# Patient Record
Sex: Male | Born: 1955 | Race: White | Hispanic: No | Marital: Married | Smoking: Former smoker
Health system: Southern US, Community
[De-identification: ages and names within clinical notes are randomized; demographics above are authoritative.]

## PROBLEM LIST (undated history)

## (undated) DIAGNOSIS — R011 Cardiac murmur, unspecified: Secondary | ICD-10-CM

## (undated) DIAGNOSIS — E785 Hyperlipidemia, unspecified: Secondary | ICD-10-CM

## (undated) DIAGNOSIS — Q251 Coarctation of aorta: Secondary | ICD-10-CM

## (undated) DIAGNOSIS — I10 Essential (primary) hypertension: Secondary | ICD-10-CM

## (undated) DIAGNOSIS — I255 Ischemic cardiomyopathy: Secondary | ICD-10-CM

## (undated) DIAGNOSIS — Q2381 Bicuspid aortic valve: Secondary | ICD-10-CM

## (undated) DIAGNOSIS — G4730 Sleep apnea, unspecified: Secondary | ICD-10-CM

## (undated) DIAGNOSIS — Q231 Congenital insufficiency of aortic valve: Secondary | ICD-10-CM

## (undated) DIAGNOSIS — I2510 Atherosclerotic heart disease of native coronary artery without angina pectoris: Secondary | ICD-10-CM

## (undated) DIAGNOSIS — E7800 Pure hypercholesterolemia, unspecified: Secondary | ICD-10-CM

## (undated) HISTORY — PX: TEE WITHOUT CARDIOVERSION: SHX5443

## (undated) HISTORY — PX: INSERT / REPLACE / REMOVE PACEMAKER: SUR710

## (undated) HISTORY — PX: CARDIAC CATHETERIZATION: SHX172

## (undated) HISTORY — PX: HERNIA REPAIR: SHX51

## (undated) HISTORY — PX: COARCTATION OF AORTA EXCISION: SUR504

## (undated) HISTORY — PX: OTHER SURGICAL HISTORY: SHX169

## (undated) HISTORY — PX: REPAIR / RESECT / TRANSPLANT BICEPS TENDON: SUR1150

---

## 2012-08-06 ENCOUNTER — Ambulatory Visit: Payer: Self-pay

## 2012-08-31 ENCOUNTER — Ambulatory Visit: Payer: Self-pay | Admitting: Emergency Medicine

## 2012-08-31 LAB — DOT URINE DIP
Blood: NEGATIVE
Protein: NEGATIVE

## 2013-04-26 ENCOUNTER — Ambulatory Visit: Payer: Self-pay | Admitting: Internal Medicine

## 2013-08-12 ENCOUNTER — Encounter: Payer: Self-pay | Admitting: Cardiothoracic Surgery

## 2013-08-12 ENCOUNTER — Emergency Department: Payer: Self-pay | Admitting: Emergency Medicine

## 2013-08-12 LAB — CBC
HCT: 38.9 % — ABNORMAL LOW (ref 40.0–52.0)
MCH: 31.4 pg (ref 26.0–34.0)
MCHC: 33.8 g/dL (ref 32.0–36.0)
MCV: 93 fL (ref 80–100)

## 2013-08-12 LAB — BASIC METABOLIC PANEL
BUN: 13 mg/dL (ref 7–18)
Calcium, Total: 9.6 mg/dL (ref 8.5–10.1)
Creatinine: 1.04 mg/dL (ref 0.60–1.30)
EGFR (African American): >60
EGFR (Non-African Amer.): >60
Osmolality: 278 (ref 275–301)
Sodium: 139 mmol/L (ref 136–145)

## 2013-08-22 ENCOUNTER — Encounter: Payer: Self-pay | Admitting: Cardiothoracic Surgery

## 2013-09-12 ENCOUNTER — Encounter: Payer: Self-pay | Admitting: Cardiothoracic Surgery

## 2013-09-22 ENCOUNTER — Encounter: Payer: Self-pay | Admitting: Cardiothoracic Surgery

## 2013-10-20 ENCOUNTER — Encounter: Payer: Self-pay | Admitting: Cardiothoracic Surgery

## 2013-11-20 ENCOUNTER — Encounter: Payer: Self-pay | Admitting: Cardiothoracic Surgery

## 2014-12-18 ENCOUNTER — Emergency Department
Admit: 2014-12-18 | Disposition: A | Discharge status: Home / Self Care | Payer: Self-pay | Admitting: Emergency Medicine

## 2015-05-21 ENCOUNTER — Other Ambulatory Visit: Payer: Self-pay | Admitting: Nurse Practitioner

## 2015-05-21 DIAGNOSIS — R1013 Epigastric pain: Secondary | ICD-10-CM

## 2015-05-21 DIAGNOSIS — R1011 Right upper quadrant pain: Secondary | ICD-10-CM

## 2015-05-21 DIAGNOSIS — K219 Gastro-esophageal reflux disease without esophagitis: Secondary | ICD-10-CM

## 2015-05-22 ENCOUNTER — Ambulatory Visit
Admission: RE | Admit: 2015-05-22 | Discharge: 2015-05-22 | Disposition: A | Discharge status: Home / Self Care | Payer: Self-pay | Source: Ambulatory Visit | Attending: Nurse Practitioner | Admitting: Nurse Practitioner

## 2015-05-22 DIAGNOSIS — R1013 Epigastric pain: Secondary | ICD-10-CM

## 2015-05-22 DIAGNOSIS — K760 Fatty (change of) liver, not elsewhere classified: Secondary | ICD-10-CM | POA: Insufficient documentation

## 2015-05-22 DIAGNOSIS — R1011 Right upper quadrant pain: Secondary | ICD-10-CM

## 2015-05-22 DIAGNOSIS — K219 Gastro-esophageal reflux disease without esophagitis: Secondary | ICD-10-CM

## 2015-05-22 DIAGNOSIS — R918 Other nonspecific abnormal finding of lung field: Secondary | ICD-10-CM | POA: Insufficient documentation

## 2015-05-22 MED ORDER — IOHEXOL 350 MG/ML SOLN
125.0000 mL | Freq: Once | INTRAVENOUS | Status: AC | PRN
  Administered 2015-05-22: 125 mL via INTRAVENOUS

## 2015-06-11 ENCOUNTER — Encounter: Payer: Self-pay | Admitting: *Deleted

## 2015-06-12 ENCOUNTER — Ambulatory Visit: Payer: MEDICAID | Admitting: Anesthesiology

## 2015-06-12 ENCOUNTER — Ambulatory Visit: Payer: Self-pay | Admitting: Anesthesiology

## 2015-06-12 ENCOUNTER — Ambulatory Visit
Admission: RE | Admit: 2015-06-12 | Discharge: 2015-06-12 | Disposition: A | Discharge status: Home / Self Care | Payer: Self-pay | Source: Ambulatory Visit | Attending: Gastroenterology | Admitting: Gastroenterology

## 2015-06-12 ENCOUNTER — Encounter: Payer: Self-pay | Admitting: *Deleted

## 2015-06-12 ENCOUNTER — Encounter
Admission: RE | Disposition: A | Discharge status: Home / Self Care | Payer: Self-pay | Source: Ambulatory Visit | Attending: Gastroenterology

## 2015-06-12 DIAGNOSIS — R1011 Right upper quadrant pain: Secondary | ICD-10-CM | POA: Insufficient documentation

## 2015-06-12 DIAGNOSIS — I10 Essential (primary) hypertension: Secondary | ICD-10-CM | POA: Insufficient documentation

## 2015-06-12 DIAGNOSIS — Z79899 Other long term (current) drug therapy: Secondary | ICD-10-CM | POA: Insufficient documentation

## 2015-06-12 DIAGNOSIS — G4733 Obstructive sleep apnea (adult) (pediatric): Secondary | ICD-10-CM | POA: Insufficient documentation

## 2015-06-12 DIAGNOSIS — Z87891 Personal history of nicotine dependence: Secondary | ICD-10-CM | POA: Insufficient documentation

## 2015-06-12 DIAGNOSIS — E785 Hyperlipidemia, unspecified: Secondary | ICD-10-CM | POA: Insufficient documentation

## 2015-06-12 DIAGNOSIS — Z955 Presence of coronary angioplasty implant and graft: Secondary | ICD-10-CM | POA: Insufficient documentation

## 2015-06-12 DIAGNOSIS — Z7982 Long term (current) use of aspirin: Secondary | ICD-10-CM | POA: Insufficient documentation

## 2015-06-12 DIAGNOSIS — Z7902 Long term (current) use of antithrombotics/antiplatelets: Secondary | ICD-10-CM | POA: Insufficient documentation

## 2015-06-12 DIAGNOSIS — R1013 Epigastric pain: Secondary | ICD-10-CM | POA: Insufficient documentation

## 2015-06-12 DIAGNOSIS — I2510 Atherosclerotic heart disease of native coronary artery without angina pectoris: Secondary | ICD-10-CM | POA: Insufficient documentation

## 2015-06-12 DIAGNOSIS — Z7951 Long term (current) use of inhaled steroids: Secondary | ICD-10-CM | POA: Insufficient documentation

## 2015-06-12 DIAGNOSIS — K210 Gastro-esophageal reflux disease with esophagitis: Secondary | ICD-10-CM | POA: Insufficient documentation

## 2015-06-12 DIAGNOSIS — E7800 Pure hypercholesterolemia, unspecified: Secondary | ICD-10-CM | POA: Insufficient documentation

## 2015-06-12 HISTORY — DX: Cardiac murmur, unspecified: R01.1

## 2015-06-12 HISTORY — DX: Pure hypercholesterolemia, unspecified: E78.00

## 2015-06-12 HISTORY — DX: Bicuspid aortic valve: Q23.81

## 2015-06-12 HISTORY — PX: ESOPHAGOGASTRODUODENOSCOPY (EGD) WITH PROPOFOL: SHX5813

## 2015-06-12 HISTORY — DX: Hyperlipidemia, unspecified: E78.5

## 2015-06-12 HISTORY — DX: Essential (primary) hypertension: I10

## 2015-06-12 HISTORY — DX: Congenital insufficiency of aortic valve: Q23.1

## 2015-06-12 HISTORY — DX: Ischemic cardiomyopathy: I25.5

## 2015-06-12 HISTORY — DX: Sleep apnea, unspecified: G47.30

## 2015-06-12 HISTORY — DX: Atherosclerotic heart disease of native coronary artery without angina pectoris: I25.10

## 2015-06-12 HISTORY — DX: Coarctation of aorta: Q25.1

## 2015-06-12 SURGERY — ESOPHAGOGASTRODUODENOSCOPY (EGD) WITH PROPOFOL
Anesthesia: General

## 2015-06-12 MED ORDER — GLYCOPYRROLATE 0.2 MG/ML IJ SOLN
INTRAMUSCULAR | Status: DC | PRN
  Administered 2015-06-12: 0.2 mg via INTRAVENOUS

## 2015-06-12 MED ORDER — LIDOCAINE HCL (CARDIAC) 20 MG/ML IV SOLN
INTRAVENOUS | Status: DC | PRN
  Administered 2015-06-12: 50 mg via INTRAVENOUS

## 2015-06-12 MED ORDER — PROPOFOL 10 MG/ML IV BOLUS
INTRAVENOUS | Status: DC | PRN
  Administered 2015-06-12: 100 mg via INTRAVENOUS

## 2015-06-12 MED ORDER — SODIUM CHLORIDE 0.9 % IV SOLN
INTRAVENOUS | Status: DC
  Administered 2015-06-12: 10:00:00 via INTRAVENOUS

## 2015-06-12 MED ORDER — PROPOFOL 500 MG/50ML IV EMUL
INTRAVENOUS | Status: DC | PRN
Start: 2015-06-12 — End: 2015-06-12
  Administered 2015-06-12: 120 ug/kg/min via INTRAVENOUS

## 2015-06-12 NOTE — Transfer of Care (Signed)
Immediate Anesthesia Transfer of Care Note  Patient: Andrew Huang  Procedure(s) Performed: Procedure(s): ESOPHAGOGASTRODUODENOSCOPY (EGD) WITH PROPOFOL (N/A)  Patient Location: Endoscopy Unit  Anesthesia Type:General  Level of Consciousness: awake  Airway & Oxygen Therapy: Patient Spontanous Breathing  Post-op Assessment: Report given to RN  Post vital signs: Reviewed  Last Vitals:  Filed Vitals:   06/12/15 1120  BP: 121/91  Pulse:   Temp: 36.4 C  Resp: 16    Complications: No apparent anesthesia complications

## 2015-06-12 NOTE — Op Note (Signed)
[facility] Gastroenterology Patient Name: Andrew Huang Procedure Date: 06/12/2015 11:05 AM MRN: [id number] Account #: [id number] Date of Birth: Feb 10, 1956 Admit Type: Outpatient Age: 59 Room: [facility] ENDO ROOM 4 Gender: Male Note Status: Finalized Procedure:         Upper GI endoscopy Indications:       Epigastric abdominal pain, Abdominal pain in the right                     upper quadrant, Suspected esophageal reflux Providers:         Ezzard Standing. Bluford Kaufmann, MD Referring MD:      Bobbie Stack Juliann Pares, MD (Referring MD) Medicines:         Monitored Anesthesia Care Complications:     No immediate complications. Procedure:         Pre-Anesthesia Assessment:                    - Prior to the procedure, a History and Physical was                     performed, and patient medications, allergies and                     sensitivities were reviewed. The patient's tolerance of                     previous anesthesia was reviewed.                    - The risks and benefits of the procedure and the sedation                     options and risks were discussed with the patient. All                     questions were answered and informed consent was obtained.                    - After reviewing the risks and benefits, the patient was                     deemed in satisfactory condition to undergo the procedure.                    After obtaining informed consent, the endoscope was passed                     under direct vision. Throughout the procedure, the                     patient's blood pressure, pulse, and oxygen saturations                     were monitored continuously. The Endoscope was introduced                     through the mouth, and advanced to the second part of                     duodenum. The upper GI endoscopy was accomplished without                     difficulty. The patient tolerated the procedure well. Findings:      LA Grade A (one  or more mucosal breaks  less than 5 mm, not extending       between tops of 2 mucosal folds) esophagitis was found at the       gastroesophageal junction. Biopsies were taken with a cold forceps for       histology.      The exam was otherwise without abnormality.      The entire examined stomach was normal.      The examined duodenum was normal. Impression:        - LA Grade A reflux esophagitis. Biopsied.                    - The examination was otherwise normal.                    - Normal stomach.                    - Normal examined duodenum. Recommendation:    - Discharge patient to home.                    - Await pathology results.                    - The findings and recommendations were discussed with the                     patient. Procedure Code(s): --- Professional ---                    [phone], Esophagogastroduodenoscopy, flexible, transoral;                     with biopsy, single or multiple Diagnosis Code(s): --- Professional ---                    K21.0, Gastro-esophageal reflux disease with esophagitis                    R10.13, Epigastric pain                    R10.11, Right upper quadrant pain CPT copyright 2014 American Medical Association. All rights reserved. The codes documented in this report are preliminary and upon coder review may  be revised to meet current compliance requirements. Wallace CullensPaul Y Pierce Biagini, MD 06/12/2015 11:13:55 AM This report has been signed electronically. Number of Addenda: 0 Note Initiated On: 06/12/2015 11:05 AM      [facility]

## 2015-06-12 NOTE — Anesthesia Preprocedure Evaluation (Signed)
Anesthesia Evaluation  Patient identified by MRN, date of birth, ID band Patient awake    Reviewed: Allergy & Precautions, H&P , NPO status , Patient's Chart, lab work & pertinent test results, reviewed documented beta blocker date and time   History of Anesthesia Complications Negative for: history of anesthetic complications  Airway Mallampati: III  TM Distance: >3 FB Neck ROM: full    Dental no notable dental hx. (+) Teeth Intact   Pulmonary neg shortness of breath, sleep apnea and Continuous Positive Airway Pressure Ventilation , neg COPD, neg recent URI, former smoker,    Pulmonary exam normal breath sounds clear to auscultation       Cardiovascular Exercise Tolerance: Good hypertension, On Medications (-) angina+ CAD and + Cardiac Stents (last placed in 2011)  (-) Past MI and (-) CABG Normal cardiovascular exam(-) dysrhythmias + Valvular Problems/Murmurs (s/p AVR) AS  Rhythm:regular Rate:Normal     Neuro/Psych neg Seizures TIAnegative psych ROS   GI/Hepatic negative GI ROS, Neg liver ROS,   Endo/Other  negative endocrine ROS  Renal/GU negative Renal ROS  negative genitourinary   Musculoskeletal   Abdominal   Peds  Hematology negative hematology ROS (+)   Anesthesia Other Findings Past Medical History:   Coronary artery disease                                      Aortic coarctation                                           Bicuspid aortic valve                                        Heart murmur                                                 Hyperlipidemia                                               Hypercholesteremia                                           Hyperlipidemia                                               Hypertension                                                 Ischemic cardiomyopathy  Sleep apnea                                                  Reproductive/Obstetrics negative OB ROS                             Anesthesia Physical Anesthesia Plan  ASA: III  Anesthesia Plan: General   Post-op Pain Management:    Induction:   Airway Management Planned:   Additional Equipment:   Intra-op Plan:   Post-operative Plan:   Informed Consent: I have reviewed the patients History and Physical, chart, labs and discussed the procedure including the risks, benefits and alternatives for the proposed anesthesia with the patient or authorized representative who has indicated his/her understanding and acceptance.   Dental Advisory Given  Plan Discussed with: Anesthesiologist, CRNA and Surgeon  Anesthesia Plan Comments:         Anesthesia Quick Evaluation

## 2015-06-12 NOTE — H&P (Signed)
Date of Initial H&P:05/21/2015  History reviewed, patient examined, no change in status, stable for surgery.   

## 2015-06-15 LAB — SURGICAL PATHOLOGY

## 2015-06-15 NOTE — Anesthesia Postprocedure Evaluation (Signed)
  Anesthesia Post-op Note  Patient: Andrew Huang  Procedure(s) Performed: Procedure(s): ESOPHAGOGASTRODUODENOSCOPY (EGD) WITH PROPOFOL (N/A)  Anesthesia type:General  Patient location: PACU  Post pain: Pain level controlled  Post assessment: Post-op Vital signs reviewed, Patient's Cardiovascular Status Stable, Respiratory Function Stable, Patent Airway and No signs of Nausea or vomiting  Post vital signs: Reviewed and stable  Last Vitals:  Filed Vitals:   06/12/15 1200  BP: 120/96  Pulse: 70  Temp:   Resp: 20    Level of consciousness: awake, alert  and patient cooperative  Complications: No apparent anesthesia complications

## 2015-06-18 ENCOUNTER — Encounter: Payer: Self-pay | Admitting: Gastroenterology

## 2016-08-08 ENCOUNTER — Other Ambulatory Visit: Payer: Self-pay | Admitting: Gastroenterology

## 2016-08-08 DIAGNOSIS — R911 Solitary pulmonary nodule: Secondary | ICD-10-CM

## 2016-08-16 ENCOUNTER — Ambulatory Visit: Payer: Self-pay

## 2016-09-29 ENCOUNTER — Ambulatory Visit
Admission: EM | Admit: 2016-09-29 | Discharge: 2016-09-29 | Disposition: A | Discharge status: Home / Self Care | Payer: Self-pay | Attending: Family Medicine | Admitting: Family Medicine

## 2016-09-29 ENCOUNTER — Ambulatory Visit
Admission: RE | Admit: 2016-09-29 | Discharge: 2016-09-29 | Disposition: A | Discharge status: Home / Self Care | Payer: Self-pay | Source: Ambulatory Visit | Attending: Family Medicine | Admitting: Family Medicine

## 2016-09-29 ENCOUNTER — Encounter: Payer: Self-pay | Admitting: *Deleted

## 2016-09-29 ENCOUNTER — Ambulatory Visit: Payer: Self-pay

## 2016-09-29 DIAGNOSIS — K5732 Diverticulitis of large intestine without perforation or abscess without bleeding: Secondary | ICD-10-CM | POA: Insufficient documentation

## 2016-09-29 DIAGNOSIS — R1032 Left lower quadrant pain: Secondary | ICD-10-CM | POA: Insufficient documentation

## 2016-09-29 LAB — COMPREHENSIVE METABOLIC PANEL
ALBUMIN: 4.7 g/dL (ref 3.5–5.0)
ALK PHOS: 81 U/L (ref 38–126)
ALT: 25 U/L (ref 17–63)
AST: 23 U/L (ref 15–41)
Anion gap: 10 (ref 5–15)
BUN: 14 mg/dL (ref 6–20)
CALCIUM: 9.3 mg/dL (ref 8.9–10.3)
CHLORIDE: 98 mmol/L — AB (ref 101–111)
CO2: 26 mmol/L (ref 22–32)
CREATININE: 1.08 mg/dL (ref 0.61–1.24)
GFR calc Af Amer: >60 mL/min (ref >60)
GFR calc non Af Amer: >60 mL/min (ref >60)
GLUCOSE: 95 mg/dL (ref 65–99)
Potassium: 3.6 mmol/L (ref 3.5–5.1)
SODIUM: 134 mmol/L — AB (ref 135–145)
Total Bilirubin: 1.1 mg/dL (ref 0.3–1.2)
Total Protein: 8.5 g/dL — ABNORMAL HIGH (ref 6.5–8.1)

## 2016-09-29 LAB — URINALYSIS, COMPLETE (UACMP) WITH MICROSCOPIC
Glucose, UA: NEGATIVE mg/dL
Hgb urine dipstick: NEGATIVE
Ketones, ur: NEGATIVE mg/dL
Leukocytes, UA: NEGATIVE
Nitrite: NEGATIVE
PROTEIN: 30 mg/dL — AB
SPECIFIC GRAVITY, URINE: 1.025 (ref 1.005–1.030)
pH: 6 (ref 5.0–8.0)

## 2016-09-29 LAB — CBC WITH DIFFERENTIAL/PLATELET
BASOS PCT: 1 %
Basophils Absolute: 0.1 10*3/uL (ref 0–0.1)
Eosinophils Absolute: 0.1 10*3/uL (ref 0–0.7)
Eosinophils Relative: 1 %
HEMATOCRIT: 47.4 % (ref 40.0–52.0)
Hemoglobin: 16.3 g/dL (ref 13.0–18.0)
LYMPHS ABS: 1.6 10*3/uL (ref 1.0–3.6)
LYMPHS PCT: 14 %
MCH: 32 pg (ref 26.0–34.0)
MCHC: 34.4 g/dL (ref 32.0–36.0)
MCV: 93 fL (ref 80.0–100.0)
MONO ABS: 1.2 10*3/uL — AB (ref 0.2–1.0)
MONOS PCT: 11 %
NEUTROS ABS: 8.3 10*3/uL — AB (ref 1.4–6.5)
Neutrophils Relative %: 73 %
Platelets: 207 10*3/uL (ref 150–440)
RBC: 5.09 MIL/uL (ref 4.40–5.90)
RDW: 13.1 % (ref 11.5–14.5)
WBC: 11.3 10*3/uL — ABNORMAL HIGH (ref 3.8–10.6)

## 2016-09-29 LAB — AMYLASE: Amylase: 93 U/L (ref 28–100)

## 2016-09-29 LAB — LIPASE, BLOOD: Lipase: 20 U/L (ref 11–51)

## 2016-09-29 MED ORDER — CIPROFLOXACIN HCL 500 MG PO TABS: 500.0000 mg | ORAL_TABLET | Freq: Two times a day (BID) | ORAL | 0 refills | Status: AC

## 2016-09-29 MED ORDER — METRONIDAZOLE 500 MG PO TABS
500.0000 mg | ORAL_TABLET | Freq: Once | ORAL | Status: AC
  Administered 2016-09-29: 500 mg via ORAL

## 2016-09-29 MED ORDER — IOPAMIDOL (ISOVUE-300) INJECTION 61%
150.0000 mL | Freq: Once | INTRAVENOUS | Status: AC | PRN
  Administered 2016-09-29: 125 mL via INTRAVENOUS

## 2016-09-29 MED ORDER — METRONIDAZOLE 500 MG PO TABS: 500.0000 mg | ORAL_TABLET | Freq: Two times a day (BID) | ORAL | 0 refills | Status: AC

## 2016-09-29 MED ORDER — CIPROFLOXACIN HCL 500 MG PO TABS
500.0000 mg | ORAL_TABLET | Freq: Once | ORAL | Status: AC
  Administered 2016-09-29: 500 mg via ORAL

## 2016-09-29 NOTE — ED Provider Notes (Signed)
[facility]    CSN: [id number] Arrival date & time: 09/29/16  1449     History   Chief Complaint Chief Complaint  Patient presents with  . Abdominal Pain    HPI Andrew Huang is a 61 y.o. male.   This is a  61 year old white male started having abdominal pain yesterday. Abdominal pain is cramping sensation state was on both sides of his lower abdomen but now is warm the left lower quadrant area. States he feels if he can have a great bowel movement things being so much better. He did have a small amount stool yesterday but was not the usual mild be expected. His eating something this afternoon but markedly decreased appetite. He had a colonoscopy about 3 years ago which was he states normal. No family history of diverticulitis Crohn's disease avulsed of colitis apparently he did smoke but he stopped in 1988. He had stents placed he's had heart valve replacement and he's had coarctation of aorta repair does well when he was 61 years old. As well as having coronary artery disease, hyperlipidemia and hernia repair.   The history is provided by the patient. No language interpreter was used.  Abdominal Pain  Pain location:  LLQ Pain quality: aching, pressure and sharp   Pain radiates to:  LLQ Pain severity:  Moderate Onset quality:  Sudden Timing:  Constant Progression:  Worsening Chronicity:  New Relieved by:  Nothing Worsened by:  Nothing Associated symptoms: belching, constipation and nausea   Risk factors: multiple surgeries and obesity     Past Medical History:  Diagnosis Date  . Aortic coarctation   . Bicuspid aortic valve   . Coronary artery disease   . Heart murmur   . Hypercholesteremia   . Hyperlipidemia   . Hyperlipidemia   . Hypertension   . Ischemic cardiomyopathy   . Sleep apnea     There are no active problems to display for this patient.   Past Surgical History:  Procedure Laterality Date  . CARDIAC CATHETERIZATION     2014 with Stent  Placement  . COARCTATION OF AORTA EXCISION     performed when patient was 61yo  . Epicardial Pacemaker Thoracotomy    . ESOPHAGOGASTRODUODENOSCOPY (EGD) WITH PROPOFOL N/A 06/12/2015   Procedure: ESOPHAGOGASTRODUODENOSCOPY (EGD) WITH PROPOFOL;  Surgeon: Wallace Cullens, MD;  Location: [facility] ENDOSCOPY;  Service: Gastroenterology;  Laterality: N/A;  . HERNIA REPAIR     Femoral Hernia  . INSERT / REPLACE / REMOVE PACEMAKER     2014  . REPAIR / RESECT / TRANSPLANT BICEPS TENDON Right   . TEE WITHOUT CARDIOVERSION         Home Medications    Prior to Admission medications   Medication Sig Start Date End Date Taking? Authorizing Provider  benazepril (LOTENSIN) 20 MG tablet Take 20 mg by mouth daily.   Yes Historical Provider, MD  clopidogrel (PLAVIX) 75 MG tablet Take 75 mg by mouth daily.   Yes Historical Provider, MD  hydrochlorothiazide (HYDRODIURIL) 12.5 MG tablet Take 12.5 mg by mouth daily.   Yes Historical Provider, MD  OMEGA-3 FATTY ACIDS PO Take 1 capsule by mouth daily.   Yes Historical Provider, MD  albuterol (PROVENTIL HFA;VENTOLIN HFA) 108 (90 BASE) MCG/ACT inhaler Inhale 2 puffs into the lungs every 6 (six) hours as needed for wheezing or shortness of breath.    Historical Provider, MD  aspirin EC 81 MG tablet Take 81 mg by mouth daily.    Historical Provider,  MD  atorvastatin (LIPITOR) 40 MG tablet Take 40 mg by mouth daily.    Historical Provider, MD  ciprofloxacin (CIPRO) 500 MG tablet Take 1 tablet (500 mg total) by mouth 2 (two) times daily. 09/29/16   Hassan Rowan, MD  esomeprazole (NEXIUM) 40 MG capsule Take 40 mg by mouth daily at 12 noon.    Historical Provider, MD  Fluticasone-Salmeterol (ADVAIR) 100-50 MCG/DOSE AEPB Inhale 1 puff into the lungs 2 (two) times daily.    Historical Provider, MD  metroNIDAZOLE (FLAGYL) 500 MG tablet Take 1 tablet (500 mg total) by mouth 2 (two) times daily. 09/29/16   Hassan Rowan, MD  sucralfate (CARAFATE) 1 G tablet Take 1 g by mouth 4 (four) times  daily -  with meals and at bedtime.    Historical Provider, MD    Family History History reviewed. No pertinent family history.  Social History Social History  Substance Use Topics  . Smoking status: Former Smoker    Quit date: 08/22/1986  . Smokeless tobacco: Never Used  . Alcohol use No     Allergies   Patient has no known allergies.   Review of Systems Review of Systems  Respiratory: Negative for apnea.   Gastrointestinal: Positive for abdominal distention, abdominal pain, constipation and nausea.  All other systems reviewed and are negative.    Physical Exam Triage Vital Signs ED Triage Vitals  Enc Vitals Group     BP 09/29/16 1513 117/75     Pulse Rate 09/29/16 1513 84     Resp 09/29/16 1513 16     Temp 09/29/16 1513 99.3 F (37.4 C)     Temp Source 09/29/16 1513 Oral     SpO2 09/29/16 1513 96 %     Weight 09/29/16 1515 235 lb (106.6 kg)     Height 09/29/16 1515 5\' 11"  (1.803 m)     Head Circumference --      Peak Flow --      Pain Score --      Pain Loc --      Pain Edu? --      Excl. in GC? --    No data found.   Updated Vital Signs BP 117/75 (BP Location: Left Arm)   Pulse 84   Temp 99.4 F (37.4 C) (Oral)   Resp 16   Ht 5\' 11"  (1.803 m)   Wt 235 lb (106.6 kg)   SpO2 96%   BMI 32.78 kg/m   Visual Acuity Right Eye Distance:   Left Eye Distance:   Bilateral Distance:    Right Eye Near:   Left Eye Near:    Bilateral Near:     Physical Exam  Constitutional: He is oriented to person, place, and time. He appears well-developed and well-nourished.  HENT:  Head: Normocephalic and atraumatic.  Right Ear: External ear normal.  Eyes: EOM are normal. Pupils are equal, round, and reactive to light.  Neck: Normal range of motion. Neck supple.  Cardiovascular: Normal rate and regular rhythm.  Exam reveals distant heart sounds.   Pulmonary/Chest: Breath sounds normal. No respiratory distress. He has no wheezes. He has no rhonchi. He has no rales.  He exhibits tenderness.  Abdominal: Soft. He exhibits distension. He exhibits no pulsatile liver and no pulsatile midline mass. Bowel sounds are decreased. There is no hepatosplenomegaly. There is tenderness. There is no rebound and no CVA tenderness. No hernia. Hernia confirmed negative in the ventral area, confirmed negative in the right inguinal area and confirmed  negative in the left inguinal area.    Musculoskeletal: Normal range of motion.  Neurological: He is alert and oriented to person, place, and time.  Skin: Skin is warm.  Psychiatric: He has a normal mood and affect.  Vitals reviewed.    UC Treatments / Results  Labs (all labs ordered are listed, but only abnormal results are displayed) Labs Reviewed  CBC WITH DIFFERENTIAL/PLATELET - Abnormal; Notable for the following:       Result Value   WBC 11.3 (*)    Neutro Abs 8.3 (*)    Monocytes Absolute 1.2 (*)    All other components within normal limits  URINALYSIS, COMPLETE (UACMP) WITH MICROSCOPIC - Abnormal; Notable for the following:    Bilirubin Urine SMALL (*)    Protein, ur 30 (*)    Squamous Epithelial / LPF 0-5 (*)    Bacteria, UA FEW (*)    All other components within normal limits  COMPREHENSIVE METABOLIC PANEL - Abnormal; Notable for the following:    Sodium 134 (*)    Chloride 98 (*)    Total Protein 8.5 (*)    All other components within normal limits  URINE CULTURE  AMYLASE  LIPASE, BLOOD    EKG  EKG Interpretation None       Radiology Ct Abdomen Pelvis W Contrast  Result Date: 09/29/2016 CLINICAL DATA:  Lower quadrant pain beginning yesterday. Diarrhea. Remote history of hernia repair and cardiac surgery. EXAM: CT ABDOMEN AND PELVIS WITH CONTRAST TECHNIQUE: Multidetector CT imaging of the abdomen and pelvis was performed using the standard protocol following bolus administration of intravenous contrast. CONTRAST:  125mL ISOVUE-300 IOPAMIDOL (ISOVUE-300) INJECTION 61% COMPARISON:  CT abdomen and  pelvis May 22, 2015 FINDINGS: LOWER CHEST: 2 mm LEFT lower lobe stable sub solid subpleural pulmonary nodule. Included heart size is normal. Moderate coronary artery calcifications. No pericardial effusion. HEPATOBILIARY: Diffusely hypodense liver compatible with steatosis. Normal gallbladder. PANCREAS: Normal. SPLEEN: Normal. ADRENALS/URINARY TRACT: Kidneys are orthotopic, demonstrating symmetric enhancement. Unchanged mild LEFT pelviectasis without frank hydronephrosis. No nephrolithiasis, or solid renal masses. The unopacified ureters are normal in course and caliber. Delayed imaging through the kidneys demonstrates symmetric prompt contrast excretion within the proximal urinary collecting system. Urinary bladder is partially distended and unremarkable. Normal adrenal glands. STOMACH/BOWEL: Mild sigmoid diverticulosis with short segment of proximal sigmoid acute diverticulitis with circumferential wall thickening and pericolonic fat stranding with trace effusion. The stomach, small bowel are normal in course and caliber without inflammatory changes. Normal appendix. VASCULAR/LYMPHATIC: Mildly ectatic infrarenal aorta at 2.3 cm, mild calcific atherosclerosis. No lymphadenopathy by CT size criteria. REPRODUCTIVE: Normal. OTHER: No intraperitoneal fluid collections or free air. MUSCULOSKELETAL: Nonacute. Minimal grade 1 L5-S1 anterolisthesis on the basis of chronic bilateral L5 pars interarticularis defects. Severe LEFT and moderate to severe RIGHT L5-S1 neural foraminal narrowing. Scattered Schmorl's nodes. IMPRESSION: Acute sigmoid diverticulitis without complication. Acute findings discussed with and reconfirmed by Dr.Lerline Valdivia on 09/29/2016 at 6:32 pm. Electronically Signed   By: Awilda Metroourtnay  Bloomer M.D.   On: 09/29/2016 18:34    Procedures Procedures (including critical care time)  Medications Ordered in UC Medications  metroNIDAZOLE (FLAGYL) tablet 500 mg (not administered)  ciprofloxacin (CIPRO)  tablet 500 mg (not administered)     Initial Impression / Assessment and Plan / UC Course  I have reviewed the triage vital signs and the nursing notes.  Pertinent labs & imaging results that were available during my care of the patient were reviewed by me and considered in my  medical decision making (see chart for details).    Strongly feel the patient has diverticulitis.Will need to get CT scan and blood work. Explained that we will discharge and attempt to get the test here.    Final Clinical Impressions(s) / UC Diagnoses   Final diagnoses:  Left lower quadrant pain  Diverticulitis of large intestine without perforation or abscess without bleeding    New Prescriptions New Prescriptions   CIPROFLOXACIN (CIPRO) 500 MG TABLET    Take 1 tablet (500 mg total) by mouth 2 (two) times daily.   METRONIDAZOLE (FLAGYL) 500 MG TABLET    Take 1 tablet (500 mg total) by mouth 2 (two) times daily.      Turns out that patient did have diverticulitis by  CT scan. Discussed case with radiologist. We will place him on Flagyl 500 mg and Cipro 500 mg twice a day for least 10 days. Because of his drugs were being closed after CT scan he requested a dose of Cipro and Flagyl here and will try to get him back on the board to do that follow-up PCP in 2 weeks as needed.    Hassan Rowan, MD 09/29/16 [phone]

## 2016-09-29 NOTE — ED Triage Notes (Signed)
Lower quadrant abd pain since yesterday. States diarrhea yesterday but no bowel movement today. Denies other symptoms.

## 2016-10-01 LAB — URINE CULTURE: Culture: NO GROWTH

## 2017-01-07 ENCOUNTER — Emergency Department
Admission: EM | Admit: 2017-01-07 | Discharge: 2017-01-07 | Disposition: A | Discharge status: Home / Self Care | Payer: Self-pay | Attending: Emergency Medicine | Admitting: Emergency Medicine

## 2017-01-07 ENCOUNTER — Encounter: Payer: Self-pay | Admitting: Emergency Medicine

## 2017-01-07 ENCOUNTER — Emergency Department: Payer: Self-pay

## 2017-01-07 DIAGNOSIS — Z7902 Long term (current) use of antithrombotics/antiplatelets: Secondary | ICD-10-CM | POA: Insufficient documentation

## 2017-01-07 DIAGNOSIS — Z87891 Personal history of nicotine dependence: Secondary | ICD-10-CM | POA: Insufficient documentation

## 2017-01-07 DIAGNOSIS — Z7982 Long term (current) use of aspirin: Secondary | ICD-10-CM | POA: Insufficient documentation

## 2017-01-07 DIAGNOSIS — I2510 Atherosclerotic heart disease of native coronary artery without angina pectoris: Secondary | ICD-10-CM | POA: Insufficient documentation

## 2017-01-07 DIAGNOSIS — I10 Essential (primary) hypertension: Secondary | ICD-10-CM | POA: Insufficient documentation

## 2017-01-07 DIAGNOSIS — Y92002 Bathroom of unspecified non-institutional (private) residence single-family (private) house as the place of occurrence of the external cause: Secondary | ICD-10-CM | POA: Insufficient documentation

## 2017-01-07 DIAGNOSIS — W19XXXA Unspecified fall, initial encounter: Secondary | ICD-10-CM

## 2017-01-07 DIAGNOSIS — Z950 Presence of cardiac pacemaker: Secondary | ICD-10-CM | POA: Insufficient documentation

## 2017-01-07 DIAGNOSIS — Z79899 Other long term (current) drug therapy: Secondary | ICD-10-CM | POA: Insufficient documentation

## 2017-01-07 DIAGNOSIS — S52502A Unspecified fracture of the lower end of left radius, initial encounter for closed fracture: Secondary | ICD-10-CM | POA: Insufficient documentation

## 2017-01-07 DIAGNOSIS — Y999 Unspecified external cause status: Secondary | ICD-10-CM | POA: Insufficient documentation

## 2017-01-07 DIAGNOSIS — W0110XA Fall on same level from slipping, tripping and stumbling with subsequent striking against unspecified object, initial encounter: Secondary | ICD-10-CM | POA: Insufficient documentation

## 2017-01-07 DIAGNOSIS — Y939 Activity, unspecified: Secondary | ICD-10-CM | POA: Insufficient documentation

## 2017-01-07 MED ORDER — DOCUSATE SODIUM 100 MG PO CAPS: ORAL_CAPSULE | ORAL | 0 refills | Status: AC

## 2017-01-07 MED ORDER — OXYCODONE-ACETAMINOPHEN 5-325 MG PO TABS
1.0000 | ORAL_TABLET | Freq: Once | ORAL | Status: AC
  Administered 2017-01-07: 1 via ORAL
  Filled 2017-01-07: qty 1

## 2017-01-07 MED ORDER — OXYCODONE-ACETAMINOPHEN 5-325 MG PO TABS
1.0000 | ORAL_TABLET | ORAL | Status: DC | PRN
  Administered 2017-01-07: 1 via ORAL

## 2017-01-07 MED ORDER — OXYCODONE-ACETAMINOPHEN 5-325 MG PO TABS: 1.0000 | ORAL_TABLET | ORAL | 0 refills | Status: DC | PRN

## 2017-01-07 MED ORDER — OXYCODONE-ACETAMINOPHEN 5-325 MG PO TABS
ORAL_TABLET | ORAL | Status: AC
  Filled 2017-01-07: qty 1

## 2017-01-07 NOTE — ED Notes (Signed)
Pt. Has wife picking up in waiting room.

## 2017-01-07 NOTE — ED Triage Notes (Signed)
Pt comes into the Ed via POV c/o left wrist pain after tripping over a bath mat and fell into the bathtub.  Patient presents trembling but states "the pain is okay I don't move it".  Patient in NAD at this time but does present with swelling to the wrist. Denies LOC.

## 2017-01-07 NOTE — ED Provider Notes (Signed)
[facility] Emergency Department Provider Note  ____________________________________________   First MD Initiated Contact with Patient 01/07/17 0114     (approximate)  I have reviewed the triage vital signs and the nursing notes.   HISTORY  Chief Complaint Wrist Pain    HPI Andrew Huang is a 61 y.o. male who presents for evaluation of acute onset sharp and severe pain in his left wrist after a fall.  He got up from bed to go to the bathroom and tripped on the rug in the bathroom and landed in the bathtub with all of his weight on the left wrist.  He did not strike his head and denies headache and neck pain.  He did not lose consciousness.  He had acute onset of pain in the wrist but has no numbness nor tingling and the pain is mild at rest but severe with any movement.  He has some mild swelling around the wrist.  He is able to move his fingers.  He sustained no other injuries.He takes a daily baby aspirin but no other blood thinners.   Past Medical History:  Diagnosis Date  . Aortic coarctation   . Bicuspid aortic valve   . Coronary artery disease   . Heart murmur   . Hypercholesteremia   . Hyperlipidemia   . Hyperlipidemia   . Hypertension   . Ischemic cardiomyopathy   . Sleep apnea     There are no active problems to display for this patient.   Past Surgical History:  Procedure Laterality Date  . CARDIAC CATHETERIZATION     2014 with Stent Placement  . COARCTATION OF AORTA EXCISION     performed when patient was 61yo  . Epicardial Pacemaker Thoracotomy    . ESOPHAGOGASTRODUODENOSCOPY (EGD) WITH PROPOFOL N/A 06/12/2015   Procedure: ESOPHAGOGASTRODUODENOSCOPY (EGD) WITH PROPOFOL;  Surgeon: Wallace Cullens, MD;  Location: [facility] ENDOSCOPY;  Service: Gastroenterology;  Laterality: N/A;  . HERNIA REPAIR     Femoral Hernia  . INSERT / REPLACE / REMOVE PACEMAKER     2014  . REPAIR / RESECT / TRANSPLANT BICEPS TENDON Right   . TEE WITHOUT  CARDIOVERSION      Prior to Admission medications   Medication Sig Start Date End Date Taking? Authorizing Provider  albuterol (PROVENTIL HFA;VENTOLIN HFA) 108 (90 BASE) MCG/ACT inhaler Inhale 2 puffs into the lungs every 6 (six) hours as needed for wheezing or shortness of breath.    [provider]  aspirin EC 81 MG tablet Take 81 mg by mouth daily.    [provider]  atorvastatin (LIPITOR) 40 MG tablet Take 40 mg by mouth daily.    [provider]  benazepril (LOTENSIN) 20 MG tablet Take 20 mg by mouth daily.    [provider]  ciprofloxacin (CIPRO) 500 MG tablet Take 1 tablet (500 mg total) by mouth 2 (two) times daily. 09/29/16   Hassan Rowan, MD  clopidogrel (PLAVIX) 75 MG tablet Take 75 mg by mouth daily.    [provider]  docusate sodium (COLACE) 100 MG capsule Take 1 tablet once or twice daily as needed for constipation while taking narcotic pain medicine 01/07/17   Loleta Rose, MD  esomeprazole (NEXIUM) 40 MG capsule Take 40 mg by mouth daily at 12 noon.    [provider]  Fluticasone-Salmeterol (ADVAIR) 100-50 MCG/DOSE AEPB Inhale 1 puff into the lungs 2 (two) times daily.    [provider]  hydrochlorothiazide (HYDRODIURIL) 12.5 MG tablet  Take 12.5 mg by mouth daily.    [provider]  metroNIDAZOLE (FLAGYL) 500 MG tablet Take 1 tablet (500 mg total) by mouth 2 (two) times daily. 09/29/16   Hassan Rowan, MD  OMEGA-3 FATTY ACIDS PO Take 1 capsule by mouth daily.    [provider]  oxyCODONE-acetaminophen (ROXICET) 5-325 MG tablet Take 1-2 tablets by mouth every 4 (four) hours as needed for severe pain. 01/07/17   Loleta Rose, MD  sucralfate (CARAFATE) 1 G tablet Take 1 g by mouth 4 (four) times daily -  with meals and at bedtime.    [provider]    Allergies Patient has no known allergies.  No family history on file.  Social History Social History  Substance Use Topics  . Smoking  status: Former Smoker    Quit date: 08/22/1986  . Smokeless tobacco: Never Used  . Alcohol use No    Review of Systems Constitutional: No fever/chills Cardiovascular: Denies chest pain. Respiratory: Denies shortness of breath. Gastrointestinal: No abdominal pain.  No nausea, no vomiting.   Genitourinary: Negative for dysuria. Musculoskeletal: ain in his left wrist after a fall.Negative for neck pain.  Negative for back pain. Integumentary: Negative for rash and lacerations Neurological: Negative for headaches, focal weakness or numbness.   ____________________________________________   PHYSICAL EXAM:  VITAL SIGNS: ED Triage Vitals  Enc Vitals Group     BP 01/07/17 0017 (!) 144/92     Pulse Rate 01/07/17 0017 76     Resp 01/07/17 0017 16     Temp 01/07/17 0017 98.2 F (36.8 C)     Temp Source 01/07/17 0017 Oral     SpO2 01/07/17 0017 100 %     Weight 01/07/17 0014 230 lb (104.3 kg)     Height 01/07/17 0014 5\' 11"  (1.803 m)     Head Circumference --      Peak Flow --      Pain Score 01/07/17 0014 8     Pain Loc --      Pain Edu? --      Excl. in GC? --     Constitutional: Alert and oriented. Well appearing and in no acute distress. Eyes: Conjunctivae are normal.  Head: Atraumatic. Cardiovascular: Normal rate, regular rhythm. Good peripheral circulationincluding in the affected extremity Respiratory: Normal respiratory effort.  No retractions.  Musculoskeletal: mild swelling around the left wrist with severe tenderness to palpation and pain with movement.  Neurovascularly intact to the hand and fingers.  I did not palpate for a pulse given the tenderness in the wrist and in the range of the radial artery but the limb is warm with normal capillary refill distally.  No significant gross deformity. Neurologic:  Normal speech and language. No gross focal neurologic deficits are appreciated.  Skin:  Skin is warm, dry and intact. No rash noted. Psychiatric: Mood and affect are  normal. Speech and behavior are normal.  ____________________________________________   LABS (all labs ordered are listed, but only abnormal results are displayed)  Labs Reviewed - No data to display ____________________________________________  EKG  None - EKG not ordered by ED physician ____________________________________________  RADIOLOGY Marylou Mccoy, personally viewed and evaluated these images (plain radiographs) as part of my medical decision making, as well as reviewing the written report by the radiologist.  Dg Wrist Complete Left  Result Date: 01/07/2017 CLINICAL DATA:  Left wrist pain after injury. Tripped over a bath mat and fell into tub. EXAM: LEFT WRIST - COMPLETE  3+ VIEW COMPARISON:  None. FINDINGS: Comminuted distal radius fracture with displaced involvement of the radiocarpal and distal radioulnar joints. Articular surface distraction of the least 4 mm. No significant angulation. No additional acute fracture. The carpal bones are intact. Soft tissue edema at the fracture site. IMPRESSION: Comminuted displaced distal radius fracture extending into the distal radioulnar and radiocarpal joints. Electronically Signed   By: Rubye OaksMelanie  Ehinger M.D.   On: 01/07/2017 00:50    ____________________________________________   PROCEDURES  Critical Care performed: No   Procedure(s) performed:   .Splint Application Date/Time: 01/07/2017 1:52 AM Performed by: Loleta RoseFORBACH, Lilyian Quayle Authorized by: Loleta RoseFORBACH, Cherron Blitzer   Consent:    Consent obtained:  Verbal   Consent given by:  Patient Pre-procedure details:    Sensation:  Normal Procedure details:    Laterality:  Left   Location:  Wrist   Wrist:  L wrist   Splint type:  Sugar tong   Supplies:  Ortho-Glass Post-procedure details:    Pain:  Unchanged   Sensation:  Normal   Patient tolerance of procedure:  Tolerated well, no immediate complications     ____________________________________________   INITIAL IMPRESSION /  ASSESSMENT AND PLAN / ED COURSE  Pertinent labs & imaging results that were available during my care of the patient were reviewed by me and considered in my medical decision making (see chart for details).  I spoke by phone with Dr. Rodena MedinEckel with orthopedics who reviewed the radiographs and believes that the patient is appropriate for splinting with no attempted reduction and outpatient follow-up.  I updated the patient with the plan and he is in agreement.  I will provide another Percocet and we will splint the wrist and elbow.  His paperwork for outpatient follow-up.  I gave my usual and customary return precautions.   Clinical Course as of Jan 08 219  Sat Jan 07, 2017  0053 DG Wrist Complete Left [CF]  0155 I reviewed the patient's prescription history over the last 12 months in the multi-state controlled substances database(s) that includes [place], [place], [place], [place], [place], [place], [place], [place], [place], [place], [place], [place], and [place].  Results were notable for regular prescriptions of Ambien but no prescriptions for opioids.  [CF]  0219 Reassessed the patient after splint placement by ED technician and the splint is in good position and the patient is neurovascularly intact.  Reiterated return precautions.  [CF]    Clinical Course User Index [CF] Loleta RoseForbach, Idamae Coccia, MD    ____________________________________________  FINAL CLINICAL IMPRESSION(S) / ED DIAGNOSES  Final diagnoses:  Closed fracture of distal end of left radius, unspecified fracture morphology, initial encounter  Fall, initial encounter     MEDICATIONS GIVEN DURING THIS VISIT:  Medications  oxyCODONE-acetaminophen (PERCOCET/ROXICET) 5-325 MG per tablet 1 tablet (1 tablet Oral Given 01/07/17 0021)  oxyCODONE-acetaminophen (PERCOCET/ROXICET) 5-325 MG per tablet 1 tablet (1 tablet Oral Given 01/07/17 0154)     NEW OUTPATIENT MEDICATIONS STARTED DURING THIS  VISIT:  New Prescriptions   DOCUSATE SODIUM (COLACE) 100 MG CAPSULE    Take 1 tablet once or twice daily as needed for constipation while taking narcotic pain medicine   OXYCODONE-ACETAMINOPHEN (ROXICET) 5-325 MG TABLET    Take 1-2 tablets by mouth every 4 (four) hours as needed for severe pain.    Modified Medications   No medications on file    Discontinued Medications   No medications on file     Note:  This document was prepared using Dragon voice  recognition software and may include unintentional dictation errors.    Loleta Rose, MD 01/07/17 [phone]

## 2017-01-07 NOTE — ED Notes (Signed)
Pt. States at about midnight tonight pt. Walked into bathroom tripped over floor mat landed on lt. Hand.  Pt. States pain to lt. Wrist.  Swelling noted to lt. Wrist.  Ice pack applied in triage.

## 2017-01-07 NOTE — Discharge Instructions (Signed)
Please read through the included information about splint care (keep it clean and dry).  If your pain becomes much more severe, the splint becomes too tight, or you feel as if your injured limb is becoming numb or cold, please return immediately to the Emergency Department.  Follow up with the orthopedics specialist listed in this paperwork.  When possible, keep your splint elevated to help with the swelling.  You may also use ice packs over the splint.

## 2017-01-23 ENCOUNTER — Ambulatory Visit: Payer: Self-pay | Admitting: Anesthesiology

## 2017-01-23 ENCOUNTER — Encounter
Admission: AD | Disposition: A | Discharge status: Home / Self Care | Payer: Self-pay | Source: Ambulatory Visit | Attending: Orthopedic Surgery

## 2017-01-23 ENCOUNTER — Ambulatory Visit: Payer: Self-pay

## 2017-01-23 ENCOUNTER — Encounter: Payer: Self-pay | Admitting: *Deleted

## 2017-01-23 ENCOUNTER — Ambulatory Visit
Admission: AD | Admit: 2017-01-23 | Discharge: 2017-01-24 | Disposition: A | Discharge status: Home / Self Care | Payer: Self-pay | Source: Ambulatory Visit | Attending: Orthopedic Surgery | Admitting: Orthopedic Surgery

## 2017-01-23 DIAGNOSIS — S52572A Other intraarticular fracture of lower end of left radius, initial encounter for closed fracture: Secondary | ICD-10-CM | POA: Insufficient documentation

## 2017-01-23 DIAGNOSIS — Z6833 Body mass index (BMI) 33.0-33.9, adult: Secondary | ICD-10-CM | POA: Insufficient documentation

## 2017-01-23 DIAGNOSIS — Z955 Presence of coronary angioplasty implant and graft: Secondary | ICD-10-CM | POA: Insufficient documentation

## 2017-01-23 DIAGNOSIS — Y939 Activity, unspecified: Secondary | ICD-10-CM | POA: Insufficient documentation

## 2017-01-23 DIAGNOSIS — K219 Gastro-esophageal reflux disease without esophagitis: Secondary | ICD-10-CM | POA: Insufficient documentation

## 2017-01-23 DIAGNOSIS — X58XXXA Exposure to other specified factors, initial encounter: Secondary | ICD-10-CM | POA: Insufficient documentation

## 2017-01-23 DIAGNOSIS — I739 Peripheral vascular disease, unspecified: Secondary | ICD-10-CM | POA: Insufficient documentation

## 2017-01-23 DIAGNOSIS — I255 Ischemic cardiomyopathy: Secondary | ICD-10-CM | POA: Insufficient documentation

## 2017-01-23 DIAGNOSIS — S52502A Unspecified fracture of the lower end of left radius, initial encounter for closed fracture: Secondary | ICD-10-CM | POA: Diagnosis present

## 2017-01-23 DIAGNOSIS — E669 Obesity, unspecified: Secondary | ICD-10-CM | POA: Insufficient documentation

## 2017-01-23 DIAGNOSIS — E7800 Pure hypercholesterolemia, unspecified: Secondary | ICD-10-CM | POA: Insufficient documentation

## 2017-01-23 DIAGNOSIS — Y929 Unspecified place or not applicable: Secondary | ICD-10-CM | POA: Insufficient documentation

## 2017-01-23 DIAGNOSIS — I2510 Atherosclerotic heart disease of native coronary artery without angina pectoris: Secondary | ICD-10-CM | POA: Insufficient documentation

## 2017-01-23 DIAGNOSIS — Z8781 Personal history of (healed) traumatic fracture: Secondary | ICD-10-CM

## 2017-01-23 DIAGNOSIS — I110 Hypertensive heart disease with heart failure: Secondary | ICD-10-CM | POA: Insufficient documentation

## 2017-01-23 DIAGNOSIS — Z79899 Other long term (current) drug therapy: Secondary | ICD-10-CM | POA: Insufficient documentation

## 2017-01-23 DIAGNOSIS — I509 Heart failure, unspecified: Secondary | ICD-10-CM | POA: Insufficient documentation

## 2017-01-23 DIAGNOSIS — G4733 Obstructive sleep apnea (adult) (pediatric): Secondary | ICD-10-CM | POA: Insufficient documentation

## 2017-01-23 DIAGNOSIS — Z9889 Other specified postprocedural states: Secondary | ICD-10-CM

## 2017-01-23 HISTORY — PX: OPEN REDUCTION INTERNAL FIXATION (ORIF) DISTAL RADIAL FRACTURE: SHX5989

## 2017-01-23 SURGERY — OPEN REDUCTION INTERNAL FIXATION (ORIF) DISTAL RADIUS FRACTURE
Anesthesia: General | Site: Wrist | Laterality: Left | Wound class: Clean

## 2017-01-23 MED ORDER — CEFAZOLIN SODIUM-DEXTROSE 2-4 GM/100ML-% IV SOLN
INTRAVENOUS | Status: AC
Start: 2017-01-23 — End: 2017-01-24
  Filled 2017-01-23: qty 100

## 2017-01-23 MED ORDER — FENTANYL CITRATE (PF) 100 MCG/2ML IJ SOLN
INTRAMUSCULAR | Status: AC
  Filled 2017-01-23: qty 2

## 2017-01-23 MED ORDER — FENTANYL CITRATE (PF) 100 MCG/2ML IJ SOLN
INTRAMUSCULAR | Status: DC | PRN
  Administered 2017-01-23: 50 ug via INTRAVENOUS
  Administered 2017-01-23: 100 ug via INTRAVENOUS
  Administered 2017-01-23 (×2): 50 ug via INTRAVENOUS

## 2017-01-23 MED ORDER — LACTATED RINGERS IV SOLN
INTRAVENOUS | Status: DC | PRN
  Administered 2017-01-23: 17:00:00 via INTRAVENOUS

## 2017-01-23 MED ORDER — ACETAMINOPHEN 10 MG/ML IV SOLN
INTRAVENOUS | Status: DC | PRN
  Administered 2017-01-23: 1000 mg via INTRAVENOUS

## 2017-01-23 MED ORDER — HYDROMORPHONE HCL 1 MG/ML IJ SOLN
INTRAMUSCULAR | Status: AC
  Filled 2017-01-23: qty 1

## 2017-01-23 MED ORDER — DEXAMETHASONE SODIUM PHOSPHATE 10 MG/ML IJ SOLN
INTRAMUSCULAR | Status: AC
  Filled 2017-01-23: qty 1

## 2017-01-23 MED ORDER — NEOMYCIN-POLYMYXIN B GU 40-200000 IR SOLN
Status: DC | PRN
  Administered 2017-01-23: 2 mL

## 2017-01-23 MED ORDER — HYDROMORPHONE HCL 1 MG/ML IJ SOLN
0.5000 mg | INTRAMUSCULAR | Status: AC | PRN
  Administered 2017-01-23 (×5): 0.5 mg via INTRAVENOUS

## 2017-01-23 MED ORDER — OXYCODONE HCL 5 MG PO TABS
ORAL_TABLET | ORAL | Status: AC
  Filled 2017-01-23: qty 1

## 2017-01-23 MED ORDER — PHENYLEPHRINE HCL 10 MG/ML IJ SOLN
INTRAMUSCULAR | Status: DC | PRN
  Administered 2017-01-23 (×2): 200 ug via INTRAVENOUS

## 2017-01-23 MED ORDER — CEFAZOLIN SODIUM-DEXTROSE 1-4 GM/50ML-% IV SOLN
1.0000 g | Freq: Four times a day (QID) | INTRAVENOUS | Status: DC
  Administered 2017-01-24 (×2): 1 g via INTRAVENOUS
  Filled 2017-01-23 (×4): qty 50

## 2017-01-23 MED ORDER — MIDAZOLAM HCL 2 MG/2ML IJ SOLN
INTRAMUSCULAR | Status: AC
  Filled 2017-01-23: qty 2

## 2017-01-23 MED ORDER — SEVOFLURANE IN SOLN
RESPIRATORY_TRACT | Status: AC
  Filled 2017-01-23: qty 250

## 2017-01-23 MED ORDER — PROMETHAZINE HCL 25 MG/ML IJ SOLN
INTRAMUSCULAR | Status: AC
  Filled 2017-01-23: qty 1

## 2017-01-23 MED ORDER — PROPOFOL 10 MG/ML IV BOLUS
INTRAVENOUS | Status: DC | PRN
  Administered 2017-01-23: 200 mg via INTRAVENOUS

## 2017-01-23 MED ORDER — SODIUM CHLORIDE 0.9 % IV SOLN: INTRAVENOUS | Status: DC

## 2017-01-23 MED ORDER — ONDANSETRON HCL 4 MG/2ML IJ SOLN
INTRAMUSCULAR | Status: DC | PRN
  Administered 2017-01-23: 4 mg via INTRAVENOUS

## 2017-01-23 MED ORDER — HYDROCODONE-ACETAMINOPHEN 5-325 MG PO TABS: 1.0000 | ORAL_TABLET | Freq: Four times a day (QID) | ORAL | 0 refills | Status: AC | PRN

## 2017-01-23 MED ORDER — FENTANYL CITRATE (PF) 100 MCG/2ML IJ SOLN
25.0000 ug | INTRAMUSCULAR | Status: DC | PRN
  Administered 2017-01-23 (×3): 50 ug via INTRAVENOUS

## 2017-01-23 MED ORDER — OXYCODONE HCL 5 MG/5ML PO SOLN: 5.0000 mg | Freq: Once | ORAL | Status: DC | PRN

## 2017-01-23 MED ORDER — DEXAMETHASONE SODIUM PHOSPHATE 10 MG/ML IJ SOLN
INTRAMUSCULAR | Status: DC | PRN
Start: 2017-01-23 — End: 2017-01-23
  Administered 2017-01-23: 10 mg via INTRAVENOUS

## 2017-01-23 MED ORDER — PROPOFOL 10 MG/ML IV BOLUS
INTRAVENOUS | Status: AC
  Filled 2017-01-23: qty 20

## 2017-01-23 MED ORDER — ONDANSETRON HCL 4 MG/2ML IJ SOLN
INTRAMUSCULAR | Status: AC
  Filled 2017-01-23: qty 2

## 2017-01-23 MED ORDER — MEPERIDINE HCL 50 MG/ML IJ SOLN: 6.2500 mg | INTRAMUSCULAR | Status: DC | PRN

## 2017-01-23 MED ORDER — LIDOCAINE HCL (CARDIAC) 20 MG/ML IV SOLN
INTRAVENOUS | Status: DC | PRN
  Administered 2017-01-23: 100 mg via INTRAVENOUS

## 2017-01-23 MED ORDER — PROMETHAZINE HCL 25 MG/ML IJ SOLN
6.2500 mg | INTRAMUSCULAR | Status: DC | PRN
  Administered 2017-01-23: 12.5 mg via INTRAVENOUS

## 2017-01-23 MED ORDER — METOCLOPRAMIDE HCL 5 MG/ML IJ SOLN: 5.0000 mg | Freq: Three times a day (TID) | INTRAMUSCULAR | Status: DC | PRN

## 2017-01-23 MED ORDER — CEFAZOLIN SODIUM-DEXTROSE 2-4 GM/100ML-% IV SOLN
2.0000 g | Freq: Once | INTRAVENOUS | Status: AC
  Administered 2017-01-23: 2 g via INTRAVENOUS

## 2017-01-23 MED ORDER — OXYCODONE HCL 5 MG PO TABS: 5.0000 mg | ORAL_TABLET | Freq: Once | ORAL | Status: DC | PRN

## 2017-01-23 MED ORDER — ACETAMINOPHEN 10 MG/ML IV SOLN
INTRAVENOUS | Status: AC
  Filled 2017-01-23: qty 100

## 2017-01-23 MED ORDER — HYDROMORPHONE HCL 1 MG/ML IJ SOLN
0.5000 mg | INTRAMUSCULAR | Status: DC | PRN
  Administered 2017-01-23: 0.5 mg via INTRAVENOUS

## 2017-01-23 MED ORDER — MIDAZOLAM HCL 2 MG/2ML IJ SOLN
INTRAMUSCULAR | Status: DC | PRN
  Administered 2017-01-23: 2 mg via INTRAVENOUS

## 2017-01-23 MED ORDER — ONDANSETRON HCL 4 MG/2ML IJ SOLN: 4.0000 mg | Freq: Four times a day (QID) | INTRAMUSCULAR | Status: DC | PRN

## 2017-01-23 MED ORDER — LACTATED RINGERS IV SOLN
Freq: Once | INTRAVENOUS | Status: AC
  Administered 2017-01-23: 15:00:00 via INTRAVENOUS

## 2017-01-23 MED ORDER — HYDROCODONE-ACETAMINOPHEN 5-325 MG PO TABS
1.0000 | ORAL_TABLET | ORAL | Status: DC | PRN
  Administered 2017-01-24 (×2): 1 via ORAL
  Filled 2017-01-23 (×2): qty 1

## 2017-01-23 MED ORDER — ONDANSETRON HCL 4 MG PO TABS: 4.0000 mg | ORAL_TABLET | Freq: Four times a day (QID) | ORAL | Status: DC | PRN

## 2017-01-23 MED ORDER — FENTANYL CITRATE (PF) 250 MCG/5ML IJ SOLN
INTRAMUSCULAR | Status: AC
  Filled 2017-01-23: qty 5

## 2017-01-23 MED ORDER — SODIUM CHLORIDE 0.9 % IV SOLN
INTRAVENOUS | Status: DC
  Administered 2017-01-23: 1000 mL via INTRAVENOUS

## 2017-01-23 MED ORDER — METOCLOPRAMIDE HCL 10 MG PO TABS: 5.0000 mg | ORAL_TABLET | Freq: Three times a day (TID) | ORAL | Status: DC | PRN

## 2017-01-23 SURGICAL SUPPLY — 38 items
BANDAGE ACE 4X5 VEL STRL LF (GAUZE/BANDAGES/DRESSINGS) ×3 IMPLANT
BIT DRILL 2 FAST STEP (BIT) ×3 IMPLANT
BIT DRILL 2.5X4 QC (BIT) ×3 IMPLANT
CANISTER SUCT 1200ML W/VALVE (MISCELLANEOUS) ×3 IMPLANT
CHLORAPREP W/TINT 26ML (MISCELLANEOUS) ×3 IMPLANT
CUFF TOURN 18 STER (MISCELLANEOUS) IMPLANT
CUFF TOURN 24 STER (MISCELLANEOUS) ×3 IMPLANT
DRAPE FLUOR MINI C-ARM 54X84 (DRAPES) ×3 IMPLANT
ELECT REM PT RETURN 9FT ADLT (ELECTROSURGICAL) ×3
ELECTRODE REM PT RTRN 9FT ADLT (ELECTROSURGICAL) ×1 IMPLANT
GAUZE PETRO XEROFOAM 1X8 (MISCELLANEOUS) ×6 IMPLANT
GAUZE SPONGE 4X4 12PLY STRL (GAUZE/BANDAGES/DRESSINGS) ×3 IMPLANT
GAUZE XEROFORM 4X4 STRL (GAUZE/BANDAGES/DRESSINGS) ×3 IMPLANT
GLOVE SURG SYN 9.0  PF PI (GLOVE) ×2
GLOVE SURG SYN 9.0 PF PI (GLOVE) ×1 IMPLANT
GOWN SRG 2XL LVL 4 RGLN SLV (GOWNS) ×1 IMPLANT
GOWN STRL NON-REIN 2XL LVL4 (GOWNS) ×2
GOWN STRL REUS W/ TWL LRG LVL3 (GOWN DISPOSABLE) ×1 IMPLANT
GOWN STRL REUS W/TWL LRG LVL3 (GOWN DISPOSABLE) ×2
KIT RM TURNOVER STRD PROC AR (KITS) ×3 IMPLANT
NEEDLE FILTER BLUNT 18X 1/2SAF (NEEDLE) ×2
NEEDLE FILTER BLUNT 18X1 1/2 (NEEDLE) ×1 IMPLANT
NS IRRIG 500ML POUR BTL (IV SOLUTION) ×3 IMPLANT
PACK EXTREMITY ARMC (MISCELLANEOUS) ×3 IMPLANT
PAD CAST CTTN 4X4 STRL (SOFTGOODS) ×1 IMPLANT
PADDING CAST COTTON 4X4 STRL (SOFTGOODS) ×2
PEG SUBCHONDRAL SMOOTH 2.0X20 (Peg) ×15 IMPLANT
PEG SUBCHONDRAL SMOOTH 2.0X22 (Peg) ×6 IMPLANT
PEG SUBCHONDRAL SMOOTH 2.0X24 (Peg) ×3 IMPLANT
PLATE SHORT 24.4X51.3 LT (Plate) ×3 IMPLANT
PLATE SHORT 24.4X51.3 RT (Plate) ×3 IMPLANT
SCREW CORT 3.5X10 LNG (Screw) ×9 IMPLANT
SPLINT CAST 1 STEP 3X12 (MISCELLANEOUS) ×3 IMPLANT
SUT ETHILON 4-0 (SUTURE) ×2
SUT ETHILON 4-0 FS2 18XMFL BLK (SUTURE) ×1
SUT VICRYL 3-0 27IN (SUTURE) ×3 IMPLANT
SUTURE ETHLN 4-0 FS2 18XMF BLK (SUTURE) ×1 IMPLANT
SYR 3ML LL SCALE MARK (SYRINGE) ×3 IMPLANT

## 2017-01-23 NOTE — Discharge Instructions (Addendum)
Keep arm elevated as much as possible. Work fingers is much as he can. Loosen Ace wrap if fingers swell. Pain medicine as directed. Keep dressing clean and dry. AMBULATORY SURGERY  DISCHARGE INSTRUCTIONS   1) The drugs that you were given will stay in your system until tomorrow so for the next 24 hours you should not:  A) Drive an automobile B) Make any legal decisions C) Drink any alcoholic beverage   2) You may resume regular meals tomorrow.  Today it is better to start with liquids and gradually work up to solid foods.  You may eat anything you prefer, but it is better to start with liquids, then soup and crackers, and gradually work up to solid foods.   3) Please notify your doctor immediately if you have any unusual bleeding, trouble breathing, redness and pain at the surgery site, drainage, fever, or pain not relieved by medication.    4) Additional Instructions:        Please contact your physician with any problems or Same Day Surgery at [phone], Monday through Friday 6 am to 4 pm, or [place] at [facility] Main number at [phone].

## 2017-01-23 NOTE — H&P (Signed)
History of the Present Illness: Andrew Huang is a 61 y.o. male here for follow-up of his left distal radius fracture. He had a comminuted fracture that occurred about 12 days ago. He was seen in the emergency room at [facility], and a sugar tong cast was applied. He presented to clinic approximately a week ago for follow-up films. The x-rays showed a comminuted fracture with good alignment. It was felt that it was a nonoperative situation and his sugar tong cast was left in place. He returned today for follow-up exam.  I have reviewed past medical, surgical, social and family history, and allergies as documented in the EMR.  Past Medical History: Past Medical History:  Diagnosis Date  . Aortic coarctation  Age 66  . Bicuspid aortic valve  . CAD (coronary artery disease)  Cardiac catheterization dated 04/26/2013  . Congestive heart failure (CHF) (CMS-HCC)  . GERD (gastroesophageal reflux disease) 06/12/2015  . Heart murmur, unspecified  . History of coronary artery stent placement  . Hypercholesterolemia  . Hyperlipidemia, unspecified  . Hypertension  . Ischemic cardiomyopathy  . Obesity, unspecified  mild  . Obstructive sleep apnea  . PVD (peripheral vascular disease) (CMS-HCC)  . Severe aortic stenosis   Past Surgical History: Past Surgical History:  Procedure Laterality Date  . Cardiac cath 04/26/2013  . CARDIAC CATHETERIZATION  . COARCTATION OF AORTA EXCISION  Performed at age 61 by Dr. Darlin Drop  . EGD 06/12/2015  GERD/Otherwise normal/No Repeat/PYO  . INSERTION EPICARDIAL PACEMAKER THORACOTOMY N/A 07/22/2013  Procedure: INSERTION EPICARDIAL LEADS THORACOTOMY; Surgeon: Wilfrid Lund, MD; Location: [facility] OPERATING ROOMS; Service: Cardiothoracic; Laterality: N/A;PATIENT STATES HE DOES NOT HAVE A PACEMAKER IMPLANT/UNABLE TO REMOVE FROM CHART/PATIENT WOULD LIKE REMOVED  . REPAIR / RESECT / TRANSPLANT BICEPS TENDON Right  . REPAIR FEMORAL HERNIA Right   . TRANSESOPHAGEAL ECHOCARDIOGRAPHY N/A 07/22/2013  Procedure: TRANSESOPHAGEAL ECHOCARDIOGRAPHY; Surgeon: Wilfrid Lund, MD; Location: [facility]PERATING ROOMS; Service: Cardiothoracic; Laterality: N/A; Performed By Anesthesia   Past Family History: Family History  Problem Relation Age of Onset  . Cancer Mother  . Coronary Artery Disease (Blocked arteries around heart) Father  . Myocardial Infarction (Heart attack) Father  . Stroke Father  . Hyperlipidemia (Elevated cholesterol) Brother  . Myocardial Infarction (Heart attack) Brother   Medications: Current Outpatient Prescriptions Ordered in Epic  Medication Sig Dispense Refill  . benazepril (LOTENSIN) 20 MG tablet TAKE 1 TABLET BY MOUTH ONCE DAILY 30 tablet 5  . clopidogrel (PLAVIX) 75 mg tablet TAKE 1 TABLET BY MOUTH ONCE DAILY 30 tablet 5  . COQ10, UBIQUINOL, ORAL Take by mouth.  . ERGOCALCIFEROL, VITAMIN D2, (VITAMIN D3 ORAL) Take by mouth.  Marland Kitchen GLUCOSAMINE HCL/CHONDROITIN SU (GLUCOSAMINE-CHONDROITIN ORAL) Take by mouth.  . hydroCHLOROthiazide (MICROZIDE) 12.5 mg capsule TAKE 1 CAPSULE BY MOUTH ONCE DAILY 30 capsule 5  . MAGNESIUM ORAL Take by mouth.  . omega-3 fatty acids/fish oil 340-1,000 mg capsule Take 1 capsule by mouth once daily.  Marland Kitchen PROBUCOL MISC Use 250 mg 2 (two) times daily. Reported on 09/30/2015  . rosuvastatin (CRESTOR) 20 MG tablet Take 2 tablets (40 mg total) by mouth once daily. 60 tablet 5  . zolpidem (AMBIEN) 10 mg tablet   No current Epic-ordered facility-administered medications on file.   Allergies: No Known Allergies   Body mass index is 33.61 kg/m.  Review of Systems: A comprehensive 14 point ROS was performed, reviewed, and the pertinent orthopaedic findings are documented in the HPI.  Vitals:  01/19/17 1225  BP:  112/75   General Physical Examination:  General/Constitutional: Well-nourished, well developed, no apparent distress. Eyes: Pupils equal, round with synchronous movement. Respiratory:  Normal respirations, no retractions except as noted in detailed exam Vascular: No edema or swelling, except as noted in detailed exam. Skin: No abnormal rashes or lesions, except as noted in detailed exam. Neurological: Oriented to person, place, and time. Psychological: Normal mood and affect. Musculoskeletal: See detailed exam below.  Musculoskeletal Examination: Physical exam today of his left wrist out of his sugar tong revealed that he had diffuse swelling about his left wrist. There was no gross deformity. Active and passive motion of course was quite limited and uncomfortable. There were no ecchymotic changes about his wrist. He had some swelling in the fingers of his left hand. His finger motion in terms of flexion was somewhat restricted and accompanied by some discomfort in his wrist. He had diffuse tenderness about his left wrist, but mostly over the distal radius. He had no obvious neurovascular deficit. He did have full active and passive motion of his left elbow and left shoulder. Manipulation of these joints did not produce pain.  Radiographs: I personally ordered and interpreted AP, lateral and oblique x-rays of his left wrist. His radial length was preserved. There was a dorsal fracture fragment that was displaced slightly. The overall alignment of his distal radius was satisfactory.  Assessment: ICD-10-CM ICD-9-CM  1. Acute pain of left wrist M25.532 719.43  2. Closed fracture of distal end of left radius with routine healing, unspecified fracture morphology, subsequent encounter S52.502D V54.12   Plan: 1. Comminuted left distal radius fracture  We applied a well-molded long arm fiberglass cast. An attempt was made to apply pressure to the dorsal fragment. I did obtain follow-up AP and lateral views. I thought his dorsal fracture fragmetn was well approximated. I am going to review his films with my partners to see if they think he is a surgical candidate. I believe he could  possibly be treated nonoperatively.   I will call the patient as soon as I get the consult with my partners.  Scribe Attestation:  Discussed with patient, plan ORIF with volar plate.  He remains N/V intact. Kennedy BuckerMichael Klare Criss, MD

## 2017-01-23 NOTE — OR Nursing (Signed)
Patient c/o nausea med given  

## 2017-01-23 NOTE — OR Nursing (Signed)
Patient will stay over night per Dr. Rosita KeaMenz order  Family aware

## 2017-01-23 NOTE — Anesthesia Post-op Follow-up Note (Cosign Needed)
Anesthesia QCDR form completed.        

## 2017-01-23 NOTE — Anesthesia Preprocedure Evaluation (Signed)
Anesthesia Evaluation  Patient identified by MRN, date of birth, ID band Patient awake    Reviewed: Allergy & Precautions, NPO status , Patient's Chart, lab work & pertinent test results  History of Anesthesia Complications Negative for: history of anesthetic complications  Airway Mallampati: II  TM Distance: >3 FB Neck ROM: Full    Dental no notable dental hx.    Pulmonary sleep apnea and Continuous Positive Airway Pressure Ventilation , neg COPD, former smoker,    breath sounds clear to auscultation- rhonchi (-) wheezing      Cardiovascular hypertension, Pt. on medications (-) angina+ CAD and + Cardiac Stents (last stent 2010)  + Valvular Problems/Murmurs (hx of bicuspid AV s/p AVR 2012)  Rhythm:Regular Rate:Normal - Systolic murmurs and - Diastolic murmurs Echo 08/20/13: MILD LV DYSFUNCTION (EF 50%) WITH MODERATE LVH NORMAL RIGHT VENTRICULAR SYSTOLIC FUNCTION VALVULAR REGURGITATION: TRIVIAL MR, TRIVIAL PR, TRIVIAL TR PROSTHETIC VALVE(S): BIOPROSTHETIC AoV LVEF LOWER LIMITS OF NORMAL. MILD NARROWING WITHOUT SIGNIFICANT GRADIENT NOTED IN THORACIC DESCENDING AORTA (PATIENT IS S/P COARCT REPAIR)   Neuro/Psych negative neurological ROS  negative psych ROS   GI/Hepatic negative GI ROS, Neg liver ROS,   Endo/Other  negative endocrine ROSneg diabetes  Renal/GU negative Renal ROS     Musculoskeletal negative musculoskeletal ROS (+)   Abdominal (+) + obese,   Peds  Hematology negative hematology ROS (+)   Anesthesia Other Findings Past Medical History: No date: Aortic coarctation No date: Bicuspid aortic valve No date: Coronary artery disease No date: Heart murmur No date: Hypercholesteremia No date: Hyperlipidemia No date: Hyperlipidemia No date: Hypertension No date: Ischemic cardiomyopathy No date: Sleep apnea   Reproductive/Obstetrics                              Anesthesia Physical Anesthesia Plan  ASA: III  Anesthesia Plan: General   Post-op Pain Management:    Induction: Intravenous  Airway Management Planned: Oral ETT  Additional Equipment:   Intra-op Plan:   Post-operative Plan: Extubation in OR  Informed Consent: I have reviewed the patients History and Physical, chart, labs and discussed the procedure including the risks, benefits and alternatives for the proposed anesthesia with the patient or authorized representative who has indicated his/her understanding and acceptance.   Dental advisory given  Plan Discussed with: CRNA and Anesthesiologist  Anesthesia Plan Comments:         Anesthesia Quick Evaluation

## 2017-01-23 NOTE — Anesthesia Procedure Notes (Signed)
Procedure Name: LMA Insertion Date/Time: 01/23/2017 5:17 PM Performed by: Junious SilkNOLES, Jonie Burdell Pre-anesthesia Checklist: Patient identified, Patient being monitored, Timeout performed, Emergency Drugs available and Suction available Patient Re-evaluated:Patient Re-evaluated prior to inductionOxygen Delivery Method: Circle system utilized Preoxygenation: Pre-oxygenation with 100% oxygen Intubation Type: IV induction Ventilation: Mask ventilation without difficulty LMA: LMA inserted LMA Size: 4.0 Tube type: Oral Number of attempts: 1 Placement Confirmation: positive ETCO2 and breath sounds checked- equal and bilateral Tube secured with: Tape Dental Injury: Teeth and Oropharynx as per pre-operative assessment

## 2017-01-23 NOTE — H&P (Signed)
Reviewed paper H+P, will be scanned into chart. No changes noted.  

## 2017-01-23 NOTE — OR Nursing (Signed)
Patient sleepy will attemp to sit up and take some crackers and drink

## 2017-01-23 NOTE — Transfer of Care (Signed)
Immediate Anesthesia Transfer of Care Note  Patient: Andrew Huang  Procedure(s) Performed: Procedure(s): OPEN REDUCTION INTERNAL FIXATION (ORIF) DISTAL RADIAL FRACTURE (Left)  Patient Location: PACU  Anesthesia Type:General  Level of Consciousness: awake and sedated  Airway & Oxygen Therapy: Patient Spontanous Breathing and Patient connected to face mask oxygen  Post-op Assessment: Report given to RN and Post -op Vital signs reviewed and stable  Post vital signs: Reviewed and stable  Last Vitals:  Vitals:   01/23/17 1449 01/23/17 1816  BP: 115/77   Pulse: 61   Resp: 16   Temp: 36.9 C 36.9 C    Last Pain:  Vitals:   01/23/17 1449  TempSrc: Oral         Complications: No apparent anesthesia complications

## 2017-01-23 NOTE — Op Note (Signed)
01/23/2017  6:00 PM  PATIENT:  Andrew Huang  61 y.o. male  PRE-OPERATIVE DIAGNOSIS:  fracture, left distal radius intra-articular displaced   POST-OPERATIVE DIAGNOSIS:  fracture same  PROCEDURE:  Procedure(s): OPEN REDUCTION INTERNAL FIXATION (ORIF) DISTAL RADIAL FRACTURE (Left)  SURGEON: Leitha SchullerMichael J Kenyah Luba, MD  ASSISTANTS: None  ANESTHESIA:   general  EBL:  No intake/output data recorded.  BLOOD ADMINISTERED:none  DRAINS: none   LOCAL MEDICATIONS USED:  NONE  SPECIMEN:  No Specimen  DISPOSITION OF SPECIMEN:  N/A  COUNTS:  YES  TOURNIQUET:   Total Tourniquet Time Documented: Upper Arm (Left) - 29 minutes Total: Upper Arm (Left) - 29 minutes   IMPLANTS: Hand innovations standard short plate with multiple pegs and screws  DICTATION: .Dragon Dictation patient brought the operating room and after adequate anesthesia was obtained left arm was prepped and draped in sterile fashion. Tourniquet was applied to the upper arm prior to prepping and the appropriate patient identification and timeout procedure completed. Tourniquet was raised and traction applied through the index and middle fingers. Volar approach is made over the FCR tendon. The tendon sheath was incised and the tendon retracted radially followed by incision of the deep tendon sheath and elevation of the quadratus off the distal radius. With traction applied and a freer elevator was used to separate the fracture fragments had which apparently healed. A distal first approach was utilized with a plate applied and then multiple smooth pegs applied. Following placement of the pegs 3 proximal 10 mm screws were placed in the proximal portion of the plate and the fracture was well reduced with restoration of volar tilt. The tourniquet was let down and the wound irrigated and closed with 3-0 Vicryl followed by 4-0 nylon in simple interrupted fashion. Xeroform 4 x 4 web roll and Ace wrap with a volar splint placed  PLAN OF CARE:  Discharge to home after PACU  PATIENT DISPOSITION:  PACU - hemodynamically stable.

## 2017-01-24 ENCOUNTER — Encounter: Payer: Self-pay | Admitting: Orthopedic Surgery

## 2017-01-24 MED ORDER — HYDROCODONE-ACETAMINOPHEN 5-325 MG PO TABS: 1.0000 | ORAL_TABLET | ORAL | 0 refills | Status: AC | PRN

## 2017-01-24 NOTE — Discharge Summary (Signed)
Physician Discharge Summary  Patient ID: Andrew Huang MRN: [id number] DOB/AGE: [birth year] 61 y.o.  Admit date: 01/23/2017 Discharge date: 01/24/2017  Admission Diagnoses:  NA   Discharge Diagnoses: Patient Active Problem List   Diagnosis Date Noted  . Distal radius fracture, left 01/23/2017    Past Medical History:  Diagnosis Date  . Aortic coarctation   . Bicuspid aortic valve   . Coronary artery disease   . Heart murmur   . Hypercholesteremia   . Hyperlipidemia   . Hyperlipidemia   . Hypertension   . Ischemic cardiomyopathy   . Sleep apnea      Transfusion: none   Consultants (if any):   Discharged Condition: Improved  Hospital Course: HAKIEM MALIZIA is an 61 y.o. male who was admitted 01/23/2017 with a diagnosis of left distal radius fracture and went to the operating room on 01/23/2017 and underwent the above named procedures.    Surgeries: Procedure(s): OPEN REDUCTION INTERNAL FIXATION (ORIF) DISTAL RADIAL FRACTURE on 01/23/2017 Patient tolerated the surgery well. Taken to PACU where she was stabilized and then transferred to the orthopedic floor.  Pain well controlled on post op day 1. VSS. Patient stable and ready for discharge home.  Implants: Hand innovations standard short plate with multiple pegs and screws  He was given perioperative antibiotics:  Anti-infectives    Start     Dose/Rate Route Frequency Ordered Stop   01/24/17 0000  ceFAZolin (ANCEF) IVPB 1 g/50 mL premix     1 g 100 mL/hr over 30 Minutes Intravenous Every 6 hours 01/23/17 2355 01/24/17 1759   01/23/17 1445  ceFAZolin (ANCEF) IVPB 2g/100 mL premix     2 g 200 mL/hr over 30 Minutes Intravenous  Once 01/23/17 1442 01/23/17 1715    .   He benefited maximally from the hospital stay and there were no complications.    Recent vital signs:  Vitals:   01/24/17 0033 01/24/17 0134  BP: 128/64 130/90  Pulse: 87 73  Resp: 19 20  Temp: 98.5 F (36.9 C) 100.1 F (37.8 C)    Recent  laboratory studies:  Lab Results  Component Value Date   HGB 16.3 09/29/2016   HGB 13.2 08/12/2013   Lab Results  Component Value Date   WBC 11.3 (H) 09/29/2016   PLT 207 09/29/2016   No results found for: INR Lab Results  Component Value Date   NA 134 (L) 09/29/2016   K 3.6 09/29/2016   CL 98 (L) 09/29/2016   CO2 26 09/29/2016   BUN 14 09/29/2016   CREATININE 1.08 09/29/2016   GLUCOSE 95 09/29/2016    Discharge Medications:   Allergies as of 01/24/2017   No Known Allergies     Medication List    STOP taking these medications   oxyCODONE-acetaminophen 5-325 MG tablet Commonly known as:  ROXICET     TAKE these medications   albuterol 108 (90 Base) MCG/ACT inhaler Commonly known as:  PROVENTIL HFA;VENTOLIN HFA Inhale 2 puffs into the lungs every 6 (six) hours as needed for wheezing or shortness of breath.   aspirin EC 81 MG tablet Take 81 mg by mouth daily.   atorvastatin 40 MG tablet Commonly known as:  LIPITOR Take 40 mg by mouth daily.   benazepril 20 MG tablet Commonly known as:  LOTENSIN Take 20 mg by mouth daily.   ciprofloxacin 500 MG tablet Commonly known as:  CIPRO Take 1 tablet (500 mg total) by mouth 2 (two) times daily.  clopidogrel 75 MG tablet Commonly known as:  PLAVIX Take 75 mg by mouth daily.   docusate sodium 100 MG capsule Commonly known as:  COLACE Take 1 tablet once or twice daily as needed for constipation while taking narcotic pain medicine   esomeprazole 40 MG capsule Commonly known as:  NEXIUM Take 40 mg by mouth daily at 12 noon.   Fluticasone-Salmeterol 100-50 MCG/DOSE Aepb Commonly known as:  ADVAIR Inhale 1 puff into the lungs 2 (two) times daily.   hydrochlorothiazide 12.5 MG tablet Commonly known as:  HYDRODIURIL Take 12.5 mg by mouth daily.   HYDROcodone-acetaminophen 5-325 MG tablet Commonly known as:  NORCO Take 1-2 tablets by mouth every 6 (six) hours as needed for moderate pain.    HYDROcodone-acetaminophen 5-325 MG tablet Commonly known as:  NORCO/VICODIN Take 1-2 tablets by mouth every 4 (four) hours as needed for moderate pain.   metroNIDAZOLE 500 MG tablet Commonly known as:  FLAGYL Take 1 tablet (500 mg total) by mouth 2 (two) times daily.   OMEGA-3 FATTY ACIDS PO Take 1 capsule by mouth daily.   sucralfate 1 g tablet Commonly known as:  CARAFATE Take 1 g by mouth 4 (four) times daily -  with meals and at bedtime.       Diagnostic Studies: Dg Wrist 2 Views Left  Result Date: 01/23/2017 CLINICAL DATA:  Distal radius fracture. EXAM: LEFT WRIST - 2 VIEW COMPARISON:  Radiographs dated 01/07/2017 FINDINGS: The patient has undergone open reduction and internal fixation of the comminuted impacted fracture of the distal left radius. Alignment of the major fracture fragments is essentially anatomic. Plate and multiple screws have been inserted. IMPRESSION: Essentially anatomic alignment and position of the major fracture fragments after open reduction and internal fixation. Electronically Signed   By: Francene BoyersJames  Maxwell M.D.   On: 01/23/2017 18:38   Dg Wrist Complete Left  Result Date: 01/07/2017 CLINICAL DATA:  Left wrist pain after injury. Tripped over a bath mat and fell into tub. EXAM: LEFT WRIST - COMPLETE 3+ VIEW COMPARISON:  None. FINDINGS: Comminuted distal radius fracture with displaced involvement of the radiocarpal and distal radioulnar joints. Articular surface distraction of the least 4 mm. No significant angulation. No additional acute fracture. The carpal bones are intact. Soft tissue edema at the fracture site. IMPRESSION: Comminuted displaced distal radius fracture extending into the distal radioulnar and radiocarpal joints. Electronically Signed   By: Rubye OaksMelanie  Ehinger M.D.   On: 01/07/2017 00:50    Disposition: 01-Home or Self Care  Discharge Instructions    CPAP continuous - face mask    Complete by:  As directed       Follow-up Information     Kennedy BuckerMenz, Michael, MD Follow up in 4 day(s).   Specialty:  Orthopedic Surgery Why:  For wound re-check, already made Contact information: [address] [facility] [place] [address] [phone]            Signed: Patience MuscaGAINES, THOMAS CHRISTOPHER 01/24/2017, 7:57 AM

## 2017-01-24 NOTE — Progress Notes (Signed)
   Subjective: 1 Day Post-Op Procedure(s) (LRB): OPEN REDUCTION INTERNAL FIXATION (ORIF) DISTAL RADIAL FRACTURE (Left) Patient reports pain as mild.   Patient is well, and has had no acute complaints or problems Denies any CP, SOB, ABD pain.   Objective: Vital signs in last 24 hours: Temp:  [97.1 F (36.2 C)-100.1 F (37.8 C)] 100.1 F (37.8 C) (06/05 0134) Pulse Rate:  [61-90] 73 (06/05 0134) Resp:  [11-20] 20 (06/05 0134) BP: (115-142)/(64-100) 130/90 (06/05 0134) SpO2:  [91 %-100 %] 96 % (06/05 0134) Weight:  [104.3 kg (230 lb)-112.4 kg (247 lb 14.4 oz)] 112.4 kg (247 lb 14.4 oz) (06/04 2203)  Intake/Output from previous day: 06/04 0701 - 06/05 0700 In: 1500 [P.O.:50; I.V.:1450] Out: 910 [Urine:900; Blood:10] Intake/Output this shift: No intake/output data recorded.  No results for input(s): HGB in the last 72 hours. No results for input(s): WBC, RBC, HCT, PLT in the last 72 hours. No results for input(s): NA, K, CL, CO2, BUN, CREATININE, GLUCOSE, CALCIUM in the last 72 hours. No results for input(s): LABPT, INR in the last 72 hours.  EXAM General - Patient is Alert, Appropriate and Oriented Left Upper Extremity - Neurovascular intact Sensation intact distally Intact pulses distally Dressing - dressing C/D/I and no drainage Motor Function - intact, fingers well on exam.   Past Medical History:  Diagnosis Date  . Aortic coarctation   . Bicuspid aortic valve   . Coronary artery disease   . Heart murmur   . Hypercholesteremia   . Hyperlipidemia   . Hyperlipidemia   . Hypertension   . Ischemic cardiomyopathy   . Sleep apnea     Assessment/Plan:   1 Day Post-Op Procedure(s) (LRB): OPEN REDUCTION INTERNAL FIXATION (ORIF) DISTAL RADIAL FRACTURE (Left) Active Problems:   Distal radius fracture, left  Estimated body mass index is 35.07 kg/m as calculated from the following:   Height as of this encounter: 5' 10.5" (1.791 m).   Weight as of this encounter:  112.4 kg (247 lb 14.4 oz).  Discharge home today Follow up with [facility] in 2 days for dressing change    T. Cranston Neighborhris Gaines, PA-C [facility] 01/24/2017, 7:54 AM

## 2017-01-26 NOTE — Anesthesia Postprocedure Evaluation (Signed)
Anesthesia Post Note  Patient: Caylan E Perryman  Procedure(s) Performed: Procedure(s) (LRB): OPEN REDUCTION INTERNAL FIXATION (ORIF) DISTAL RADIAL FRACTURE (Left)  Patient location during evaluation: PACU Anesthesia Type: General Level of consciousness: awake and alert Pain management: pain level controlled Vital Signs Assessment: post-procedure vital signs reviewed and stable Respiratory status: spontaneous breathing, nonlabored ventilation, respiratory function stable and patient connected to nasal cannula oxygen Cardiovascular status: blood pressure returned to baseline and stable Postop Assessment: no signs of nausea or vomiting Anesthetic complications: no     Last Vitals:  Vitals:   01/24/17 0033 01/24/17 0134  BP: 128/64 130/90  Pulse: 87 73  Resp: 19 20  Temp: 36.9 C 37.8 C    Last Pain:  Vitals:   01/24/17 0845  TempSrc:   PainSc: 3                  Yevette EdwardsJames G Adams

## 2017-02-21 ENCOUNTER — Encounter: Payer: Self-pay | Admitting: Orthopedic Surgery

## 2017-02-28 ENCOUNTER — Ambulatory Visit: Payer: Self-pay | Attending: Orthopedic Surgery | Admitting: Occupational Therapy

## 2017-02-28 DIAGNOSIS — M6281 Muscle weakness (generalized): Secondary | ICD-10-CM

## 2017-02-28 DIAGNOSIS — L905 Scar conditions and fibrosis of skin: Secondary | ICD-10-CM

## 2017-02-28 DIAGNOSIS — M25632 Stiffness of left wrist, not elsewhere classified: Secondary | ICD-10-CM

## 2017-02-28 DIAGNOSIS — M25532 Pain in left wrist: Secondary | ICD-10-CM

## 2017-02-28 DIAGNOSIS — M25642 Stiffness of left hand, not elsewhere classified: Secondary | ICD-10-CM

## 2017-02-28 NOTE — Therapy (Signed)
[facility] [address] [place], [place], [address] Phone: [phone]   Fax:  [phone]  Occupational Therapy Evaluation  Patient Details  Name: Azell DerScott E Wnek MRN: [id number] Date of Birth: 06/29/1956 Referring Provider: Floyce StakesGaines  Encounter Date: 02/28/2017      OT End of Session - 02/28/17 1448    Visit Number 1   Number of Visits 12   Date for OT Re-Evaluation 04/11/17   OT Start Time 1247   OT Stop Time 1350   OT Time Calculation (min) 63 min   Activity Tolerance Patient tolerated treatment well   Behavior During Therapy [facility] for tasks assessed/performed      Past Medical History:  Diagnosis Date  . Aortic coarctation   . Bicuspid aortic valve   . Coronary artery disease   . Heart murmur   . Hypercholesteremia   . Hyperlipidemia   . Hyperlipidemia   . Hypertension   . Ischemic cardiomyopathy   . Sleep apnea     Past Surgical History:  Procedure Laterality Date  . CARDIAC CATHETERIZATION     2014 with Stent Placement  . COARCTATION OF AORTA EXCISION     performed when patient was 61yo  . Epicardial Pacemaker Thoracotomy    . ESOPHAGOGASTRODUODENOSCOPY (EGD) WITH PROPOFOL N/A 06/12/2015   Procedure: ESOPHAGOGASTRODUODENOSCOPY (EGD) WITH PROPOFOL;  Surgeon: Wallace CullensPaul Y Oh, MD;  [facility];  Service: Gastroenterology;  Laterality: N/A;  . HERNIA REPAIR     Femoral Hernia  . INSERT / REPLACE / REMOVE PACEMAKER     2014  . OPEN REDUCTION INTERNAL FIXATION (ORIF) DISTAL RADIAL FRACTURE Left 01/23/2017   Procedure: OPEN REDUCTION INTERNAL FIXATION (ORIF) DISTAL RADIAL FRACTURE;  Surgeon: Kennedy BuckerMenz, Michael, MD;  Location: [facility] ORS;  Service: Orthopedics;  Laterality: Left;  . REPAIR / RESECT / TRANSPLANT BICEPS TENDON Right   . TEE WITHOUT CARDIOVERSION      There were no vitals filed for this visit.      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 No/denies           [facility] OT Assessment - 02/28/17 0001      Assessment   Diagnosis L wrist ORIF    Referring Provider Floyce StakesGaines   Onset Date 01/23/17     Precautions   Required Braces or Orthoses --  still wearing wrist splint      Balance Screen   Has the patient fallen in the past 6 months Yes   How many times? 1   Has the patient had a decrease in activity level because of a fear of falling?  No   Is the patient reluctant to leave their home because of a fear of falling?  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 , fix things around rental properties, yard work, house work      AROM   Left Forearm Pronation 80 Degrees   Left Forearm Supination 45 Degrees   Left Wrist Extension 32 Degrees   Left Wrist Flexion 26 Degrees   Left Wrist Radial Deviation 5 Degrees   Left Wrist Ulnar Deviation 20 Degrees     Left Hand AROM   L Thumb MCP 0-60 45 Degrees   L Thumb IP 0-80 47 Degrees   L Thumb Radial ADduction/ABduction 0-55 45   L Thumb Palmar ADduction/ABduction 0-45 43   L Thumb Opposition to Index --  Opposition to 2nd    L Index  MCP 0-90 80 Degrees   L Index PIP 0-100 95 Degrees   L Long  MCP 0-90 80 Degrees   L Long PIP 0-100 95 Degrees   L Ring  MCP 0-90 75 Degrees   L Ring PIP 0-100 90 Degrees   L Little  MCP 0-90 70 Degrees   L Little PIP 0-100 80 Degrees      evaluation done   fluidotherapy done with AROM in it to increase ROM  Review HEP afterwards and handout provided      Contrast  Scar massage  cica scar pad for night time  AROM Tendon glides  AROM THumb PA and RA  Opposition - pick up 2 cm foam block - all digits - try 5th every day   PROM for wrist flexion , ext, RD , UD over edge of table  And prayer stretch  wrist flexion stretch   AROM for wrist in all planes  Ice if needed  2-3  Day  10 reps                   OT Education - 02/28/17 1448    Education provided Yes   Education Details findings, nerve healin, HEP    Person(s) Educated Patient   Methods Explanation;Demonstration;Tactile cues;Verbal cues;Handout   Comprehension Verbal cues required;Returned demonstration;Verbalized understanding          OT Short Term Goals - 02/28/17 1454      OT SHORT TERM GOAL #1   Title AROM in L hand digits improve for pt to touch palm and do opposition to hold onto 1 cm diameter tool , do buttons with more ease    Baseline se flowsheet    Time 2   Status New     OT SHORT TERM GOAL #2   Title L wrist AROM improve with 15-20 degrees to use hand 50% of ADL's    Baseline see flowsheet    Time 4   Period Weeks   Status New     OT SHORT TERM GOAL #3   Title Pt  to be ind in HEP to decrease scar tissue, numbness , edema and increase ROM and strength to return to use L hand in  ADL's more than 50% of time    Baseline no knowledge            OT Long Term Goals - 02/28/17 1459      OT LONG TERM GOAL #1   Title Function on PRHWE improve with more than 20 points    Baseline Function score at eval 31.5/50   Time 6   Period Days   Status New     OT LONG TERM GOAL #2   Title Pain on PRWHE improve with more than 12 points  Baseline pain at eval 21/50    Time 6   Period Weeks   Status New     OT LONG TERM GOAL #3   Title  L grip strength improve to more than 50% compare to R hand to return to doing yard and use tools    Baseline NT yet- 5 wks s/p   Time 6   Period Weeks   Status New               Plan -  02/28/17 1450    Clinical Impression Statement Pt present 5 wks s/p R wrist ORIF - pt show increase edema , increase pain with ROM , decrease stiffness in digits and wrist - it appear pt was not taking his steriod correctly - pt to  go to pharmacy and see what he can do to fix it - fitted with neoprene splint to wean into during day -  pt show still numbness in thumb and palm - but improve per  pt in 2nd digit - pt scar adhere and in need for scar management -  decrease strength - all limitiing her functional use of  L hand and wirst    Occupational performance deficits (Please refer to evaluation for details): IADL's;Play;ADL's;Work;Leisure   Rehab Potential Good   OT Frequency 2x / week   OT Duration 6 weeks   OT Treatment/Interventions Self-care/ADL training;Fluidtherapy;Splinting;Patient/family education;Therapeutic exercises;Contrast Bath;Scar mobilization;Passive range of motion;Manual Therapy;Parrafin;Cryotherapy   Plan reasses how did with HEP - review and update    Clinical Decision Making Multiple treatment options, significant modification of task necessary   OT Home Exercise Plan see pt instruction   Consulted and Agree with Plan of Care Patient      Patient will benefit from skilled therapeutic intervention in order to improve the following deficits and impairments:  Decreased coordination, Decreased range of motion, Impaired flexibility, Increased edema, Pain, Impaired UE functional use, Decreased strength, Decreased scar mobility, Impaired sensation  Visit Diagnosis: Scar condition and fibrosis of skin - Plan: Ot plan of care cert/re-cert  Stiffness of left wrist, not elsewhere classified - Plan: Ot plan of care cert/re-cert  Stiffness of left hand, not elsewhere classified - Plan: Ot plan of care cert/re-cert  Pain in left wrist - Plan: Ot plan of care cert/re-cert  Muscle weakness (generalized) - Plan: Ot plan of care cert/re-cert    Problem List Patient Active Problem  List   Diagnosis Date Noted  . Distal radius fracture, left 01/23/2017    Oletta Cohn OTR/L,CLT  02/28/2017, 3:06 PM  [facility] [address], [place], [address] Phone: [phone]   Fax:  [phone]  Name: REYAN HELLE MRN: [id number] Date of Birth: 11/18/1955

## 2017-02-28 NOTE — Patient Instructions (Signed)
Contrast  Scar massage  cica scar pad for night time  AROM Tendon glides  AROM THumb PA and RA  Opposition - pick up 2 cm foam block - all digits - try 5th every day   PROM for wrist flexion , ext, RD , UD over edge of table  And prayer stretch  wrist flexion stretch   AROM for wrist in all planes  Ice if needed  2-3  Day  10 reps

## 2017-03-02 ENCOUNTER — Ambulatory Visit: Payer: Self-pay | Admitting: Occupational Therapy

## 2017-03-02 DIAGNOSIS — M25642 Stiffness of left hand, not elsewhere classified: Secondary | ICD-10-CM

## 2017-03-02 DIAGNOSIS — L905 Scar conditions and fibrosis of skin: Secondary | ICD-10-CM

## 2017-03-02 DIAGNOSIS — M25632 Stiffness of left wrist, not elsewhere classified: Secondary | ICD-10-CM

## 2017-03-02 DIAGNOSIS — M25532 Pain in left wrist: Secondary | ICD-10-CM

## 2017-03-02 DIAGNOSIS — M6281 Muscle weakness (generalized): Secondary | ICD-10-CM

## 2017-03-05 ENCOUNTER — Encounter: Payer: Self-pay | Admitting: Occupational Therapy

## 2017-03-05 NOTE — Therapy (Signed)
[facility] [address] [place], [address] Phone: [phone]   Fax:  [phone]  Occupational Therapy Treatment  Patient Details  Name: ALDAN CAMEY MRN: [id number] Date of Birth: 1956-03-11 Referring Provider: Floyce Stakes  Encounter Date: 03/02/2017      OT End of Session - 03/05/17 2202    Visit Number 2   Number of Visits 12   Date for OT Re-Evaluation 04/11/17   OT Start Time 1258   OT Stop Time 1353   OT Time Calculation (min) 55 min   Activity Tolerance Patient tolerated treatment well   Behavior During Therapy [facility] for tasks assessed/performed      Past Medical History:  Diagnosis Date  . Aortic coarctation   . Bicuspid aortic valve   . Coronary artery disease   . Heart murmur   . Hypercholesteremia   . Hyperlipidemia   . Hyperlipidemia   . Hypertension   . Ischemic cardiomyopathy   . Sleep apnea     Past Surgical History:  Procedure Laterality Date  . CARDIAC CATHETERIZATION     2014 with Stent Placement  . COARCTATION OF AORTA EXCISION     performed when patient was 61yo  . Epicardial Pacemaker Thoracotomy    . ESOPHAGOGASTRODUODENOSCOPY (EGD) WITH PROPOFOL N/A 06/12/2015   Procedure: ESOPHAGOGASTRODUODENOSCOPY (EGD) WITH PROPOFOL;  Surgeon: Wallace Cullens, MD;  Location: [facility] ENDOSCOPY;  Service: Gastroenterology;  Laterality: N/A;  . HERNIA REPAIR     Femoral Hernia  . INSERT / REPLACE / REMOVE PACEMAKER     2014  . OPEN REDUCTION INTERNAL FIXATION (ORIF) DISTAL RADIAL FRACTURE Left 01/23/2017   Procedure: OPEN REDUCTION INTERNAL FIXATION (ORIF) DISTAL RADIAL FRACTURE;  Surgeon: Kennedy Bucker, MD;  Location: [facility] ORS;  Service: Orthopedics;  Laterality: Left;  . REPAIR / RESECT / TRANSPLANT BICEPS TENDON Right   . TEE WITHOUT CARDIOVERSION      There were no vitals filed for this visit.      Subjective Assessment - 03/05/17 2200    Subjective  Patient states, "I know you!"   when seeing therapist in the waiting room.  Patient familiar with this therapist from a previous episode of care.  Patient reports he has been doing well, feels a bit stiff today.   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 No/denies   Multiple Pain Sites No         Fluidotherapy performed for 10 minutes with cues for AROM during fluidotherapy to  increase ROM    Scar massage performed to left hand at surgical site manually with cross friction Patient reports he has been wearing cica scar pad for night time as directed.  AROM Tendon glides  AROM Thumb palmar ABD and Radial ABD Exercises with focus on Opposition of thumb to each digit with 2 cm foam block - all digits - cues for small finger oppositional movements.    PROM for wrist flexion , ext, radial and ulnar deviation (edge of table),  prayer stretch with cues for technique.  Wrist flexion stretch with cues for proper technique.  AROM for wrist in all planes  Ice pack at end of session for 5 minutes. Measurements as follows:   Left wrist extension 34 degrees, flexion 45 degrees. Left thumb MCP 52 degrees, left thumb radial  abduction 50 degrees, palmar abduction 45 degrees.                       OT Education - 03/05/17 2202    Education provided Yes   Education Details exercises for home program for PROM, prayer stretch, scar massage and contrast baths.    Person(s) Educated Patient   Methods Explanation;Demonstration;Verbal cues   Comprehension Verbal cues required;Returned demonstration;Verbalized understanding          OT Short Term Goals - 02/28/17 1454      OT SHORT TERM GOAL #1   Title AROM in L hand digits improve for pt to touch palm and do opposition to hold onto 1 cm diameter tool , do buttons with more ease    Baseline se flowsheet    Time 2   Status New     OT  SHORT TERM GOAL #2   Title L wrist AROM improve with 15-20 degrees to use hand 50% of ADL's    Baseline see flowsheet    Time 4   Period Weeks   Status New     OT SHORT TERM GOAL #3   Title Pt  to be ind in HEP to decrease scar tissue, numbness , edema and increase ROM and strength to return to use L hand in  ADL's more than 50% of time    Baseline no knowledge            OT Long Term Goals - 02/28/17 1459      OT LONG TERM GOAL #1   Title Function on PRHWE improve with more than 20 points    Baseline Function score at eval 31.5/50   Time 6   Period Days   Status New     OT LONG TERM GOAL #2   Title Pain on PRWHE improve with more than 12 points    Baseline pain at eval 21/50    Time 6   Period Weeks   Status New     OT LONG TERM GOAL #3   Title  L grip strength improve to more than 50% compare to R hand to return to doing yard and use tools    Baseline NT yet- 5 wks s/p   Time 6   Period Weeks   Status New               Plan - 03/05/17 2203    Clinical Impression Statement Patient reports calling pharmacy and getting his steroid medication reviewed and back on plan for course of tapering.  Patient continues to work on exercises in the clinic and at home for management of edema, ROM, scar management, and increased functional use of left hand and wrist.  Patient requires cues for proper technique for exercises and continues to benefit from skilled OT to improve hand function and decrease pain.  Patient to work on continued contrast baths at home for edema management and focus on ROM exercises as directed.    Rehab Potential Good   OT Frequency 2x / week   OT Duration 6 weeks   OT Treatment/Interventions Self-care/ADL training;Fluidtherapy;Splinting;Patient/family education;Therapeutic exercises;Contrast Bath;Scar mobilization;Passive range of motion;Manual Therapy;Parrafin;Cryotherapy   Consulted and Agree with Plan of Care Patient      Patient will benefit  from skilled therapeutic intervention in order to improve the following deficits and impairments:  Decreased coordination, Decreased range of motion, Impaired flexibility, Increased edema, Pain, Impaired UE functional use, Decreased strength, Decreased scar mobility,  Impaired sensation  Visit Diagnosis: Muscle weakness (generalized)  Scar condition and fibrosis of skin  Stiffness of left wrist, not elsewhere classified  Stiffness of left hand, not elsewhere classified  Pain in left wrist    Problem List Patient Active Problem List   Diagnosis Date Noted  . Distal radius fracture, left 01/23/2017   Shanan Mcmiller T Nanie Dunkleberger, OTR/L, CLT  Zaul Hubers 03/05/2017, 10:23 PM  [facility] [address] [place], [place], [address] Phone: [phone]   Fax:  [phone]  Name: Azell DerScott E Toepfer MRN: [id number] Date of Birth: 07/01/1956

## 2017-03-07 ENCOUNTER — Encounter: Payer: Self-pay | Admitting: Occupational Therapy

## 2017-03-07 ENCOUNTER — Ambulatory Visit: Payer: Self-pay | Admitting: Occupational Therapy

## 2017-03-07 DIAGNOSIS — M6281 Muscle weakness (generalized): Secondary | ICD-10-CM

## 2017-03-07 DIAGNOSIS — M25632 Stiffness of left wrist, not elsewhere classified: Secondary | ICD-10-CM

## 2017-03-07 DIAGNOSIS — M25532 Pain in left wrist: Secondary | ICD-10-CM

## 2017-03-07 DIAGNOSIS — M25642 Stiffness of left hand, not elsewhere classified: Secondary | ICD-10-CM

## 2017-03-07 DIAGNOSIS — L905 Scar conditions and fibrosis of skin: Secondary | ICD-10-CM

## 2017-03-07 NOTE — Therapy (Signed)
[facility] [address] [place], [address] Phone: [phone]   Fax:  [phone]  Occupational Therapy Treatment  Patient Details  Name: Andrew Huang MRN: [id number] Date of Birth: [birth year] Referring Provider: Floyce Stakes  Encounter Date: 03/07/2017      OT End of Session - 03/07/17 1952    Visit Number 3   Number of Visits 12   Date for OT Re-Evaluation 04/11/17   OT Start Time 1050   OT Stop Time 1135   OT Time Calculation (min) 45 min   Activity Tolerance Patient tolerated treatment well   Behavior During Therapy [facility] for tasks assessed/performed      Past Medical History:  Diagnosis Date  . Aortic coarctation   . Bicuspid aortic valve   . Coronary artery disease   . Heart murmur   . Hypercholesteremia   . Hyperlipidemia   . Hyperlipidemia   . Hypertension   . Ischemic cardiomyopathy   . Sleep apnea     Past Surgical History:  Procedure Laterality Date  . CARDIAC CATHETERIZATION     2014 with Stent Placement  . COARCTATION OF AORTA EXCISION     performed when patient was 61yo  . Epicardial Pacemaker Thoracotomy    . ESOPHAGOGASTRODUODENOSCOPY (EGD) WITH PROPOFOL N/A 06/12/2015   Procedure: ESOPHAGOGASTRODUODENOSCOPY (EGD) WITH PROPOFOL;  Surgeon: Wallace Cullens, MD;  Location: [facility] ENDOSCOPY;  Service: Gastroenterology;  Laterality: N/A;  . HERNIA REPAIR     Femoral Hernia  . INSERT / REPLACE / REMOVE PACEMAKER     2014  . OPEN REDUCTION INTERNAL FIXATION (ORIF) DISTAL RADIAL FRACTURE Left 01/23/2017   Procedure: OPEN REDUCTION INTERNAL FIXATION (ORIF) DISTAL RADIAL FRACTURE;  Surgeon: Kennedy Bucker, MD;  Location: [facility] ORS;  Service: Orthopedics;  Laterality: Left;  . REPAIR / RESECT / TRANSPLANT BICEPS TENDON Right   . TEE WITHOUT CARDIOVERSION      There were no vitals filed for this visit.      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 No/denies            [facility] OT Assessment - 03/07/17 0001      AROM   Left Wrist Extension (P)  42 Degrees   Left Wrist Flexion (P)  35 Degrees   Left Wrist Radial Deviation (P)  14 Degrees   Left Wrist Ulnar Deviation (P)  22 Degrees     Left Hand AROM   L Thumb MCP 0-60 (P)  48 Degrees   L Thumb IP 0-80 (P)  57 Degrees   L Thumb Radial ADduction/ABduction 0-55 (P)  53   L Thumb Palmar ADduction/ABduction 0-45 (P)  60   L Index PIP 0-100 (P)  97 Degrees   L Long PIP 0-100 (P)  100 Degrees   L Ring PIP 0-100 (P)  100 Degrees   L Little PIP 0-100 (P)  90 Degrees                  OT  Treatments/Exercises (OP) - 03/07/17 0001      LUE Fluidotherapy   Number Minutes Fluidotherapy 10 Minutes   LUE Fluidotherapy Location Hand;Wrist   Comments AROM for wrist and digits in all planes prior to session       Assess AROM for wrist and digits - see flowsheet  fluidotherapy done with AROM in it to increase ROM digits and wrist Pt to do contrast 2-3 x day prior to HEP  isotoner glove as needed during day and night time   AROM Tendon glides  And AAROM full fist to one finger out of palm - and touching palm AROM THumb PA and RA  AAROM for thumb IP and MP flexion  AAROM composite thumb flexion to base of 4th gradually - add to HEP  Opposition - pick up 2 cm foam block - all digits  PROM for wrist flexion , ext, RD , UD over edge of table - but pain this date with flexion at ulnar wrist - tender  Pt to hold off on flexion AROM and PROM that cause pain  Can cont and done in clinic  prayer stretch   AROM for wrist in all planes pain free Ice if needed  2-3  Day   10 reps  Add light blue putty for gripping , lat and 3 point grip - 10 -15 reps  but pain free               OT Education - 03/07/17 1951    Education provided Yes   Education Details HEP update, hold off on flexion if pain - add putty and isotoner glove   Person(s) Educated Patient   Methods Explanation;Demonstration;Tactile cues;Verbal cues;Handout   Comprehension Verbalized understanding;Verbal cues required;Returned demonstration          OT Short Term Goals - 02/28/17 1454      OT SHORT TERM GOAL #1   Title AROM in L hand digits improve for pt to touch palm and do opposition to hold onto 1 cm diameter tool , do buttons with more ease    Baseline se flowsheet    Time 2   Status New     OT SHORT TERM GOAL #2   Title L wrist AROM improve with 15-20 degrees to use hand 50% of ADL's    Baseline see flowsheet    Time 4   Period Weeks   Status New     OT SHORT TERM GOAL #3   Title Pt  to be ind in HEP to decrease scar tissue, numbness , edema and increase ROM and strength to return to use L hand in  ADL's more than 50% of time    Baseline no knowledge            OT Long Term Goals - 02/28/17 1459      OT LONG TERM GOAL #1   Title Function on PRHWE improve with more than 20 points    Baseline Function score at eval 31.5/50   Time 6   Period Days   Status New     OT LONG TERM GOAL #2   Title Pain on PRWHE improve with more than 12 points    Baseline pain at eval 21/50    Time 6   Period Weeks   Status New     OT LONG TERM GOAL #3   Title  L grip strength improve to more than 50% compare to R hand to return to doing yard and use  tools    Baseline NT yet- 5 wks s/p   Time 6   Period Weeks   Status New               Plan - 03/07/17 1955    Clinical Impression Statement pt made progress in ROM at wrist and digits  still edema over MC's and pain this date over ulnar side of wrist with wrist flexion - tender - pt to hold off on any flexion  with pain - and keep other HEP pain under 1-2/10 - do contrast every time after HEP and only do HEP 2-3 x day and use in light activities - not  exercises fingers constantly    Occupational performance deficits (Please refer to evaluation for details): ADL's;IADL's;Work;Play;Leisure   Rehab Potential Good   OT Frequency 2x / week   OT Duration 6 weeks   OT Treatment/Interventions Self-care/ADL training;Fluidtherapy;Splinting;Patient/family education;Therapeutic exercises;Contrast Bath;Scar mobilization;Passive range of motion;Manual Therapy;Parrafin;Cryotherapy   Plan Assess pain on ulnar side of wrist - Progress- maybe pulse US ,   Clinical Decision Making Multiple treatment options, significant modification of task necessary   OT Home Exercise Plan see pt instruction   Consulted and Agree with Plan of Care Patient      Patient will benefit from skilled therapeutic intervention in order to improve the following deficits and impairments:  Decreased coordination, Decreased range of motion, Impaired flexibility, Increased edema, Pain, Impaired UE functional use, Decreased strength, Decreased scar mobility, Impaired sensation  Visit Diagnosis: Scar condition and fibrosis of skin  Stiffness of left wrist, not elsewhere classified  Muscle weakness (generalized)  Stiffness of left hand, not elsewhere classified  Pain in left wrist    Problem List Patient Active Problem List   Diagnosis Date Noted  . Distal radius fracture, left 01/23/2017    Oletta CohnuPreez, Eliot Bencivenga OTR/L,CLT 03/07/2017, 8:12 PM  [facility] [address] [place], [place], [address] Phone: [phone]   Fax:  [phone]  Name: Andrew Huang MRN: [id number] Date of Birth: 01/26/1956

## 2017-03-07 NOTE — Patient Instructions (Signed)
Pt to do contrast 2-3 x day prior to HEP  isotoner glove as needed during day and night time     AAROM for thumb IP and MP flexion  AAROM composite thumb flexion to base of 4th gradually - add to HEP  Opposition - pick up 2 cm foam block - all digits  PROM for wrist flexion , ext, RD , UD over edge of table - but pain this date with flexion at ulnar wrist - tender  Pt to hold off on flexion AROM and PROM that cause pain  Can cont and done in clinic  prayer stretch   AROM for wrist in all planes pain free Ice if needed  2-3  Day  10 reps  Add light blue putty for gripping , lat and 3 point grip - 10 -15 reps  but pain free

## 2017-03-10 ENCOUNTER — Ambulatory Visit: Payer: Self-pay | Admitting: Occupational Therapy

## 2017-03-10 DIAGNOSIS — M25532 Pain in left wrist: Secondary | ICD-10-CM

## 2017-03-10 DIAGNOSIS — M25642 Stiffness of left hand, not elsewhere classified: Secondary | ICD-10-CM

## 2017-03-10 DIAGNOSIS — L905 Scar conditions and fibrosis of skin: Secondary | ICD-10-CM

## 2017-03-10 DIAGNOSIS — M25632 Stiffness of left wrist, not elsewhere classified: Secondary | ICD-10-CM

## 2017-03-10 DIAGNOSIS — M6281 Muscle weakness (generalized): Secondary | ICD-10-CM

## 2017-03-11 ENCOUNTER — Encounter: Payer: Self-pay | Admitting: Occupational Therapy

## 2017-03-11 NOTE — Therapy (Signed)
[facility] [address] , [place], [address] Phone: [phone]   Fax:  [phone]  Occupational Therapy Treatment  Patient Details  Name: Andrew Huang E Bessire MRN: [id number] Date of Birth: 03/04/1956 Referring Provider: Floyce StakesGaines  Encounter Date: 03/10/2017      OT End of Session - 03/11/17 2211    Visit Number 4   Number of Visits 12   Date for OT Re-Evaluation 04/11/17   OT Start Time 1118   OT Stop Time 1200   OT Time Calculation (min) 42 min   Activity Tolerance Patient tolerated treatment well   Behavior [facility] for tasks assessed/performed      Past Medical History:  Diagnosis Date  . Aortic coarctation   . Bicuspid aortic valve   . Coronary artery disease   . Heart murmur   . Hypercholesteremia   . Hyperlipidemia   . Hyperlipidemia   . Hypertension   . Ischemic cardiomyopathy   . Sleep apnea     Past Surgical History:  Procedure Laterality Date  . CARDIAC CATHETERIZATION     2014 with Stent Placement  . COARCTATION OF AORTA EXCISION     performed when patient was 61yo  . Epicardial Pacemaker Thoracotomy    . ESOPHAGOGASTRODUODENOSCOPY (EGD) WITH PROPOFOL N/A 06/12/2015   Procedure: ESOPHAGOGASTRODUODENOSCOPY (EGD) WITH PROPOFOL;  Surgeon: Wallace CullensPaul Y Oh, MD;  [facility];  Service: Gastroenterology;  Laterality: N/A;  . HERNIA REPAIR     Femoral Hernia  . INSERT / REPLACE / REMOVE PACEMAKER     2014  . OPEN REDUCTION INTERNAL FIXATION (ORIF) DISTAL RADIAL FRACTURE Left 01/23/2017   Procedure: OPEN REDUCTION INTERNAL FIXATION (ORIF) DISTAL RADIAL FRACTURE;  Surgeon: Kennedy BuckerMenz, Michael, MD;  Location: [facility] ORS;  Service: Orthopedics;  Laterality: Left;  . REPAIR / RESECT / TRANSPLANT BICEPS TENDON Right   . TEE WITHOUT CARDIOVERSION      There were no vitals filed for this visit.      Subjective Assessment - 03/11/17 2205    Subjective  Patient reports he still has  numbness in the left thumb and some pain on the ulnar side of the hand.  Patient reports he has not done contrast baths at home, has been busy.  Patient reports edema has continued.    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 Yes   Pain Score 2    Pain Location Hand   Pain Orientation Left   Pain Descriptors / Indicators Aching   Multiple Pain Sites No      Contrast for 8 mins, alternating warm 3 mins, cold 1 min for 2 cycles for edema control.      Pt to perform contrast 2-3 x day prior to HEP  Continue with isotoner glove as needed during day and night time    Patient performed exercises as follows with cues for technique:  AROM Tendon glides  And AAROM full fist to one finger out of palm - and touching palm AROM THumb PA and RA  AAROM for thumb IP and MP flexion  AAROM composite thumb flexion to base of 4th gradually - add to HEP  Opposition - pick up 2 cm foam block - all digits  prayer stretch, AROM for wrist in all planes pain free 2-3 times a day,  light blue putty for gripping , lat and 3 point grip - 10 -15 reps but pain free\  Pulsed [place] for 4 minutes to ulnar side of the hand on the left, 3.3 mHz, intensity of 1.0  Ice at end of session for 5 minutes prior to departure.                     OT Education - 03/11/17 2210    Education provided Yes   Education Details home exercises, edema control, contrast and glove   Person(s) Educated Patient   Methods Explanation;Demonstration;Verbal cues   Comprehension Verbal cues required;Returned demonstration;Verbalized understanding          OT Short Term Goals - 02/28/17 1454      OT SHORT TERM GOAL #1   Title AROM in L hand digits improve for pt to touch palm and do opposition to hold onto 1 cm diameter tool , do buttons with more ease    Baseline se flowsheet    Time 2    Status New     OT SHORT TERM GOAL #2   Title L wrist AROM improve with 15-20 degrees to use hand 50% of ADL's    Baseline see flowsheet    Time 4   Period Weeks   Status New     OT SHORT TERM GOAL #3   Title Pt  to be ind in HEP to decrease scar tissue, numbness , edema and increase ROM and strength to return to use L hand in  ADL's more than 50% of time    Baseline no knowledge            OT Long Term Goals - 02/28/17 1459      OT LONG TERM GOAL #1   Title Function on PRHWE improve with more than 20 points    Baseline Function score at eval 31.5/50   Time 6   Period Days   Status New     OT LONG TERM GOAL #2   Title Pain on PRWHE improve with more than 12 points    Baseline pain at eval 21/50    Time 6   Period Weeks   Status New     OT LONG TERM GOAL #3   Title  L grip strength improve to more than 50% compare to R hand to return to doing yard and use tools    Baseline NT yet- 5 wks s/p   Time 6   Period Weeks   Status New               Plan - 03/11/17 2211    Clinical Impression Statement Patient continues to demonstrate increased edema in left hand and needs to be consistent with performance of contrast at home in conjunction with use of isotoner glove to help promote edema control.  Pt to continue with current exercises for ROM and not push passive motion.  Patient continues to progress and encouraged him to be consistent with edema control measures and ROM over the next few days until he returns.    Rehab Potential Good   OT Frequency 2x / week   OT Duration 6 weeks   OT Treatment/Interventions Self-care/ADL training;Fluidtherapy;Splinting;Patient/family education;Therapeutic exercises;Contrast Bath;Scar mobilization;Passive range of motion;Manual Therapy;Parrafin;Cryotherapy   Consulted and Agree with Plan of Care Patient      Patient will benefit from skilled therapeutic intervention in order to improve the following deficits and impairments:   Decreased coordination, Decreased range of  motion, Impaired flexibility, Increased edema, Pain, Impaired UE functional use, Decreased strength, Decreased scar mobility, Impaired sensation  Visit Diagnosis: Scar condition and fibrosis of skin  Stiffness of left wrist, not elsewhere classified  Muscle weakness (generalized)  Stiffness of left hand, not elsewhere classified  Pain in left wrist    Problem List Patient Active Problem List   Diagnosis Date Noted  . Distal radius fracture, left 01/23/2017   Hiroshi Krummel T Yair Dusza, OTR/L, CLT  Zaniah Titterington 03/11/2017, 10:18 PM  [facility] [address] [place], [address] Phone: [phone]   Fax:  [phone]  Name: JATERRIUS RICKETSON MRN: [id number] Date of Birth: 06/14/1956

## 2017-03-13 ENCOUNTER — Ambulatory Visit: Payer: Self-pay | Admitting: Occupational Therapy

## 2017-03-16 ENCOUNTER — Ambulatory Visit: Payer: Self-pay | Admitting: Occupational Therapy

## 2017-03-23 ENCOUNTER — Ambulatory Visit: Payer: Self-pay | Attending: Orthopedic Surgery | Admitting: Occupational Therapy

## 2017-03-23 DIAGNOSIS — M25532 Pain in left wrist: Secondary | ICD-10-CM

## 2017-03-23 DIAGNOSIS — M25642 Stiffness of left hand, not elsewhere classified: Secondary | ICD-10-CM

## 2017-03-23 DIAGNOSIS — L905 Scar conditions and fibrosis of skin: Secondary | ICD-10-CM

## 2017-03-23 DIAGNOSIS — Z8781 Personal history of (healed) traumatic fracture: Secondary | ICD-10-CM | POA: Insufficient documentation

## 2017-03-23 DIAGNOSIS — Z967 Presence of other bone and tendon implants: Secondary | ICD-10-CM | POA: Insufficient documentation

## 2017-03-23 DIAGNOSIS — M6281 Muscle weakness (generalized): Secondary | ICD-10-CM

## 2017-03-23 DIAGNOSIS — M25632 Stiffness of left wrist, not elsewhere classified: Secondary | ICD-10-CM

## 2017-03-25 ENCOUNTER — Encounter: Payer: Self-pay | Admitting: Occupational Therapy

## 2017-03-25 NOTE — Therapy (Signed)
[facility] [address] [place], [address] Phone: [phone]   Fax:  [phone]  Occupational Therapy Treatment  Patient Details  Name: Andrew Huang MRN: [id number] Date of Birth: Dec 13, 1955 Referring Provider: Floyce Stakes  Encounter Date: 03/23/2017      OT End of Session - 03/25/17 1909    Visit Number 5   Number of Visits 12   Date for OT Re-Evaluation 04/11/17   OT Start Time 1545   OT Stop Time 1635   OT Time Calculation (min) 50 min   Activity Tolerance Patient tolerated treatment well   Behavior During Therapy [facility] for tasks assessed/performed      Past Medical History:  Diagnosis Date  . Aortic coarctation   . Bicuspid aortic valve   . Coronary artery disease   . Heart murmur   . Hypercholesteremia   . Hyperlipidemia   . Hyperlipidemia   . Hypertension   . Ischemic cardiomyopathy   . Sleep apnea     Past Surgical History:  Procedure Laterality Date  . CARDIAC CATHETERIZATION     2014 with Stent Placement  . COARCTATION OF AORTA EXCISION     performed when patient was 61yo  . Epicardial Pacemaker Thoracotomy    . ESOPHAGOGASTRODUODENOSCOPY (EGD) WITH PROPOFOL N/A 06/12/2015   Procedure: ESOPHAGOGASTRODUODENOSCOPY (EGD) WITH PROPOFOL;  Surgeon: Wallace Cullens, MD;  Location: [facility] ENDOSCOPY;  Service: Gastroenterology;  Laterality: N/A;  . HERNIA REPAIR     Femoral Hernia  . INSERT / REPLACE / REMOVE PACEMAKER     2014  . OPEN REDUCTION INTERNAL FIXATION (ORIF) DISTAL RADIAL FRACTURE Left 01/23/2017   Procedure: OPEN REDUCTION INTERNAL FIXATION (ORIF) DISTAL RADIAL FRACTURE;  Surgeon: Kennedy Bucker, MD;  Location: [facility] ORS;  Service: Orthopedics;  Laterality: Left;  . REPAIR / RESECT / TRANSPLANT BICEPS TENDON Right   . TEE WITHOUT CARDIOVERSION      There were no vitals filed for this visit.      Subjective Assessment - 03/25/17 1905    Subjective  Patient reports he got confused  last week about his appointment time and missed coming to therapy.  Thought his appt was on Friday.  Patient reports edema in better in the hand but the fingers still feel tight.  He went to see the MD yesterday and had xrays.  His fracture is now healed but he has a calcification forming in the area which may eventually affect his range of motion.     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 Yes   Pain Score 3    Pain Location Hand   Pain Orientation Left   Pain Descriptors / Indicators Aching   Pain Type Acute pain   Pain Onset More than a month ago   Pain Frequency Constant   Aggravating Factors  ulnar side of the wrist greater with pronation than supination.   Multiple Pain Sites No            Pt to perform contrast 2-3 x day prior to HEP  Continue with isotoner glove as needed during day and night time  Contrast to left hand and wrist for 3 mins warm, 1 min cold, repeated for 2 cycles.  Patient performed exercises as follows with cues for technique:  AROM Tendon glides  And AAROM full fist to one finger out of palm - and touching palm AROM THumb PA and RA  AAROM for thumb IP and MP flexion  AAROM composite thumb flexion to base of 4th gradually - add to HEP  Opposition - pick up 2 cm foam block - all digits prayer stretch, AROM for wrist in all planes pain free 2-3 times a day,  light blue putty for gripping , lat and 3 point grip - 10 -15 repsbut pain free\  Pulsed US for 4 minutes to ulnar side of the hand on the left, 3.3 mHz, intensity of .8 Hammer exercise for ROM/stretch with short lever arm with cues for technique. Wrist circumference left 205 cm, right 19.2 cm   Ice at end of session for 5 minutes prior to departure.                      OT Education - 03/25/17 1908    Education provided Yes   Education  Details HEP, contrast baths, to work on wrist and digit ROM, consistency   Person(s) Educated Patient   Methods Explanation;Demonstration;Verbal cues   Comprehension Verbal cues required;Returned demonstration;Verbalized understanding          OT Short Term Goals - 02/28/17 1454      OT SHORT TERM GOAL #1   Title AROM in L hand digits improve for pt to touch palm and do opposition to hold onto 1 cm diameter tool , do buttons with more ease    Baseline se flowsheet    Time 2   Status New     OT SHORT TERM GOAL #2   Title L wrist AROM improve with 15-20 degrees to use hand 50% of ADL's    Baseline see flowsheet    Time 4   Period Weeks   Status New     OT SHORT TERM GOAL #3   Title Pt  to be ind in HEP to decrease scar tissue, numbness , edema and increase ROM and strength to return to use L hand in  ADL's more than 50% of time    Baseline no knowledge            OT Long Term Goals - 02/28/17 1459      OT LONG TERM GOAL #1   Title Function on PRHWE improve with more than 20 points    Baseline Function score at eval 31.5/50   Time 6   Period Days   Status New     OT LONG TERM GOAL #2   Title Pain on PRWHE improve with more than 12 points    Baseline pain at eval 21/50    Time 6   Period Weeks   Status New     OT LONG TERM GOAL #3   Title  L grip strength improve to more than 50% compare to R hand to return to doing yard and use tools    Baseline NT yet- 5 wks s/p   Time 6   Period Weeks   Status New               Plan - 03/25/17 1910    Clinical Impression Statement Patient concerned about calcification forming in the left hand and discussing the potential to maybe have surgery to remove it especially if it starts to impair his motion.  Patient reports he has not been consistent with exercises in the last week. Discussed the need to make himself a  priority and really work on ROM and exercises for his wrist and hand.  He is able to perform opposition of  the thumb to the base of the ring finger but not to small finger.  He reports some numbness in his thumb and thenar area.  Pain still present on the ulnar side of the wrist greater with pronation than supination.  Wrist circumference on the left 20.5 cm compared to right 19.2 cm.  Patient reports MD said he can start with hammer exercises.  Therapist recommends patient start with ROM and stretches with use of hammer with short lever arm first before progressing to strengthening.  Continue towards goals to improve ROM and functional use for daily tasks.    Rehab Potential Good   OT Frequency 2x / week   OT Duration 6 weeks   OT Treatment/Interventions Self-care/ADL training;Fluidtherapy;Splinting;Patient/family education;Therapeutic exercises;Contrast Bath;Scar mobilization;Passive range of motion;Manual Therapy;Parrafin;Cryotherapy   Consulted and Agree with Plan of Care Patient      Patient will benefit from skilled therapeutic intervention in order to improve the following deficits and impairments:  Decreased coordination, Decreased range of motion, Impaired flexibility, Increased edema, Pain, Impaired UE functional use, Decreased strength, Decreased scar mobility, Impaired sensation  Visit Diagnosis: Scar condition and fibrosis of skin  Stiffness of left wrist, not elsewhere classified  Muscle weakness (generalized)  Stiffness of left hand, not elsewhere classified  Pain in left wrist    Problem List Patient Active Problem List   Diagnosis Date Noted  . Distal radius fracture, left 01/23/2017   Annaleia Pence T Theodore Rahrig, OTR/L, CLT  Elsia Lasota 03/25/2017, 7:18 PM   [facility] [address] [place], [place], [address] Phone: [phone]   Fax:  [phone]  Name: Andrew Huang MRN: [id number] Date of Birth: 03/12/1956

## 2017-03-29 ENCOUNTER — Ambulatory Visit: Payer: Self-pay | Admitting: Occupational Therapy

## 2017-03-29 DIAGNOSIS — L905 Scar conditions and fibrosis of skin: Secondary | ICD-10-CM

## 2017-03-29 DIAGNOSIS — M25642 Stiffness of left hand, not elsewhere classified: Secondary | ICD-10-CM

## 2017-03-29 DIAGNOSIS — M25532 Pain in left wrist: Secondary | ICD-10-CM

## 2017-03-29 DIAGNOSIS — M6281 Muscle weakness (generalized): Secondary | ICD-10-CM

## 2017-03-29 DIAGNOSIS — M25632 Stiffness of left wrist, not elsewhere classified: Secondary | ICD-10-CM

## 2017-03-30 ENCOUNTER — Encounter: Payer: Self-pay | Admitting: Occupational Therapy

## 2017-03-30 NOTE — Therapy (Signed)
[facility] [address] [place], [address] Phone: [phone]   Fax:  [phone]  Occupational Therapy Treatment  Patient Details  Name: Andrew Huang MRN: [id number] Date of Birth: December 02, 1955 Referring Provider: Floyce Stakes  Encounter Date: 03/29/2017      OT End of Session - 03/30/17 0936    Visit Number 6   Number of Visits 12   Date for OT Re-Evaluation 04/11/17   OT Start Time 1405   OT Stop Time 1506   OT Time Calculation (min) 61 min   Activity Tolerance Patient tolerated treatment well   Behavior During Therapy [facility] for tasks assessed/performed      Past Medical History:  Diagnosis Date  . Aortic coarctation   . Bicuspid aortic valve   . Coronary artery disease   . Heart murmur   . Hypercholesteremia   . Hyperlipidemia   . Hyperlipidemia   . Hypertension   . Ischemic cardiomyopathy   . Sleep apnea     Past Surgical History:  Procedure Laterality Date  . CARDIAC CATHETERIZATION     2014 with Stent Placement  . COARCTATION OF AORTA EXCISION     performed when patient was 61yo  . Epicardial Pacemaker Thoracotomy    . ESOPHAGOGASTRODUODENOSCOPY (EGD) WITH PROPOFOL N/A 06/12/2015   Procedure: ESOPHAGOGASTRODUODENOSCOPY (EGD) WITH PROPOFOL;  Surgeon: Wallace Cullens, MD;  Location: [facility] ENDOSCOPY;  Service: Gastroenterology;  Laterality: N/A;  . HERNIA REPAIR     Femoral Hernia  . INSERT / REPLACE / REMOVE PACEMAKER     2014  . OPEN REDUCTION INTERNAL FIXATION (ORIF) DISTAL RADIAL FRACTURE Left 01/23/2017   Procedure: OPEN REDUCTION INTERNAL FIXATION (ORIF) DISTAL RADIAL FRACTURE;  Surgeon: Kennedy Bucker, MD;  Location: [facility] ORS;  Service: Orthopedics;  Laterality: Left;  . REPAIR / RESECT / TRANSPLANT BICEPS TENDON Right   . TEE WITHOUT CARDIOVERSION      There were no vitals filed for this visit.      Subjective Assessment - 03/30/17 0931    Subjective  Patient reports he is feeling  better with his range of motion, pain is better now 2/10.  He is still performing contrast at home around 2 times a day most days.  Reports being more consistent with exercises and home program in the last couple days.    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 Yes   Pain Score 2    Pain Location Hand   Pain Orientation Left   Pain Descriptors / Indicators Aching   Pain Type Acute pain   Pain Onset More than a month ago   Pain Frequency Constant   Multiple Pain Sites No            [facility] OT Assessment - 03/30/17 0001      AROM   Left Forearm Pronation 80 Degrees   Left Forearm Supination 45 Degrees   Left Wrist Extension 25 Degrees   Left Wrist Flexion 30 Degrees   Left Wrist Radial Deviation 10 Degrees   Left Wrist Ulnar Deviation 25 Degrees     Left Hand AROM   L Thumb MCP 0-60 45 Degrees   L Thumb IP 0-80 65 Degrees   L Thumb Radial ADduction/ABduction 0-55 55   L Thumb Palmar ADduction/ABduction 0-45 45  L Index  MCP 0-90 90 Degrees   L Index PIP 0-100 90 Degrees   L Long  MCP 0-90 90 Degrees   L Long PIP 0-100 90 Degrees   L Ring  MCP 0-90 90 Degrees   L Ring PIP 0-100 95 Degrees   L Little  MCP 0-90 90 Degrees   L Little PIP 0-100 80 Degrees        Paraffin to left wrist and hand this date for 10 minutes prior to ROM exercises.  Patient performed exercises as follows with cues for technique:  AROM Tendon glides, AAROM full fisting, AROM Thumb PA and RA , AAROM for thumb IP and MP flexion  AAROM composite thumb flexion Opposition - pick up 2 cm foam block - all digits  prayer stretch, AROM for wrist in all planes pain free 2-3 times a day,  light blue putty for gripping , lat and 3 point grip - 10 -15 reps but pain free   Pulsed [place] for 4 minutes to ulnar side of the hand on the left, 3.3 mHz, intensity of .8 Wrist  circumference left 19.9 cm, right 19.2 cm See flowsheet for current measurements for progress   Pt to continue to perform contrast 2-3 x day prior to HEP  Continue with isotoner glove as needed during day and night time                    OT Education - 03/30/17 0936    Education provided Yes   Education Details functional use of hand, pain management, ROM   Person(s) Educated Patient   Methods Explanation;Demonstration;Verbal cues   Comprehension Verbal cues required;Returned demonstration;Verbalized understanding          OT Short Term Goals - 02/28/17 1454      OT SHORT TERM GOAL #1   Title AROM in L hand digits improve for pt to touch palm and do opposition to hold onto 1 cm diameter tool , do buttons with more ease    Baseline se flowsheet    Time 2   Status New     OT SHORT TERM GOAL #2   Title L wrist AROM improve with 15-20 degrees to use hand 50% of ADL's    Baseline see flowsheet    Time 4   Period Weeks   Status New     OT SHORT TERM GOAL #3   Title Pt  to be ind in HEP to decrease scar tissue, numbness , edema and increase ROM and strength to return to use L hand in  ADL's more than 50% of time    Baseline no knowledge            OT Long Term Goals - 02/28/17 1459      OT LONG TERM GOAL #1   Title Function on PRHWE improve with more than 20 points    Baseline Function score at eval 31.5/50   Time 6   Period Days   Status New     OT LONG TERM GOAL #2   Title Pain on PRWHE improve with more than 12 points    Baseline pain at eval 21/50    Time 6   Period Weeks   Status New     OT LONG TERM GOAL #3   Title  L grip strength improve to more than 50% compare to R hand to return to doing yard and use tools    Baseline NT yet- 5 wks  s/p   Time 6   Period Weeks   Status New               Plan - 03/30/17 [id number]    Clinical Impression Statement Patient improving with ROM and decreased pain.  Pain level now down to a 2 and patient  has been more consistent with exercises at home.  Wrist circumference decreased this date compared to last week. He continues to demonstrate some mild edema but has improved over the last couple weeks.  He continues to perform contrast at home.  Measurements increased for supination (after paraffin today to 60 degrees), wrist flexion, ulnar/radial deviation, IP fleion of thumb, Rad/palm ABD of thumb and opposition now to lateral side of RF.  Patient continues to benefit from skilled OT to increase independence in daily tasks with improved functional use of left hand and wrist.       Patient will benefit from skilled therapeutic intervention in order to improve the following deficits and impairments:     Visit Diagnosis: Scar condition and fibrosis of skin  Stiffness of left wrist, not elsewhere classified  Muscle weakness (generalized)  Stiffness of left hand, not elsewhere classified  Pain in left wrist    Problem List Patient Active Problem List   Diagnosis Date Noted  . Distal radius fracture, left 01/23/2017   Neomi Laidler T Dusten Ellinwood, OTR/L, CLT  Rylen Hou 03/30/2017, 11:20 AM  [facility] [address] [place], [place], [address] Phone: [phone]   Fax:  [phone]  Name: Andrew Huang MRN: [id number] Date of Birth: 09/29/1955

## 2017-04-05 ENCOUNTER — Ambulatory Visit: Payer: Self-pay | Admitting: Occupational Therapy

## 2017-04-06 ENCOUNTER — Ambulatory Visit: Payer: Self-pay | Admitting: Occupational Therapy

## 2017-04-06 DIAGNOSIS — M25532 Pain in left wrist: Secondary | ICD-10-CM

## 2017-04-06 DIAGNOSIS — M25642 Stiffness of left hand, not elsewhere classified: Secondary | ICD-10-CM

## 2017-04-06 DIAGNOSIS — L905 Scar conditions and fibrosis of skin: Secondary | ICD-10-CM

## 2017-04-06 DIAGNOSIS — M25632 Stiffness of left wrist, not elsewhere classified: Secondary | ICD-10-CM

## 2017-04-06 DIAGNOSIS — M6281 Muscle weakness (generalized): Secondary | ICD-10-CM

## 2017-04-06 NOTE — Patient Instructions (Signed)
add rubber band for thumb PA 1 0reps   pain with attempts of RA - hold off    Upgrade putty to teal for grip , lat and 3 point  15 reps 2 x day  Review with pt doing hammer exercise correct for supination - support forearm and hold in middle - relax into stretch  10 reps  add thumb flexion stretch into palm and RD - 1 0reps  Hold 3 sec - pain should be less than 3/10  Wrist flexion and extnetion stretch in standing to be done - over edge of counter - 10 reps  Hold off on PROM for RDand UD   hammer holding close to head - 10 reps for flexion and extention

## 2017-04-06 NOTE — Therapy (Signed)
[facility] [facility] [address] [place], [address] Phone: [phone]   Fax:  [phone]  Occupational Therapy Treatment  Patient Details  Name: Andrew Huang MRN: [id number] Date of Birth: 30-May-1956 Referring Provider: Floyce Stakes  Encounter Date: 04/06/2017      OT End of Session - 04/06/17 1521    Visit Number 7   Number of Visits 12   Date for OT Re-Evaluation 04/11/17   OT Start Time 1300   OT Stop Time 1413   OT Time Calculation (min) 73 min   Activity Tolerance Patient tolerated treatment well   Behavior During Therapy [facility] for tasks assessed/performed      Past Medical History:  Diagnosis Date  . Aortic coarctation   . Bicuspid aortic valve   . Coronary artery disease   . Heart murmur   . Hypercholesteremia   . Hyperlipidemia   . Hyperlipidemia   . Hypertension   . Ischemic cardiomyopathy   . Sleep apnea     Past Surgical History:  Procedure Laterality Date  . CARDIAC CATHETERIZATION     2014 with Stent Placement  . COARCTATION OF AORTA EXCISION     performed when patient was 61yo  . Epicardial Pacemaker Thoracotomy    . ESOPHAGOGASTRODUODENOSCOPY (EGD) WITH PROPOFOL N/A 06/12/2015   Procedure: ESOPHAGOGASTRODUODENOSCOPY (EGD) WITH PROPOFOL;  Surgeon: Wallace Cullens, MD;  [facility] END[facility]COPY;  Service: Gastroenterology;  Laterality: N/A;  . HERNIA REPAIR     Femoral Hernia  . INSERT / REPLACE / REMOVE PACEMAKER     2014  . OPEN REDUCTION INTERNAL FIXATION (ORIF) DISTAL RADIAL FRACTURE Left 01/23/2017   Procedure: OPEN REDUCTION INTERNAL FIXATION (ORIF) DISTAL RADIAL FRACTURE;  Surgeon: Kennedy Bucker, MD;  Location: [facility] ORS;  Service: Orthopedics;  Laterality: Left;  . REPAIR / RESECT / TRANSPLANT BICEPS TENDON Right   . TEE WITHOUT CARDIOVERSION      There were no vitals filed for this visit.      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 Yes   Pain Score 2    Pain Location Wrist   Pain Orientation Left   Pain Descriptors / Indicators Aching            [facility] OT Assessment - 04/06/17 0001      AROM   Left Forearm Pronation 90 Degrees   Left Forearm Supination 60 Degrees   Left Wrist Extension 35 Degrees   Left Wrist Flexion 30 Degrees     Strength   Right Hand Grip (lbs) 120   Right Hand Lateral Pinch 25 lbs   Right Hand 3 Point Pinch 23 lbs   Left Hand Grip (lbs) 35   Left Hand Lateral Pinch 14 lbs   Left Hand 3 Point Pinch 8 lbs  OT Treatments/Exercises (OP) - 04/06/17 0001      LUE Contrast Bath   Time 8 minutes   Comments prior to ROM , BTE to increase ROM - pt report conrrast feels the best prior to Therapy       Assess thumb ROM , strength and some atrophy in webspace  Pt report numbness in thumb about the same - no improving  Done and add rubber band for thumb PA 1 0reps   pain with attempts of RA - hold off  During AROM measurements for wrist - pt had pain with UD and RD   tenderness over ulnar side of wrist still  But now show tightness /positiive Finkelstein   Wrist flexion and extention about the same - Supination better   pain during ROM on dorsal wrist -  After contrast  Upgrade putty to teal for grip , lat and 3 point  15 reps 2 x day  Review with pt doing hammer exercise correct for supination - support forearm and hold in middle - relax into stretch  10 reps  add thumb flexion stretch into  palm and RD - 1 0reps  Hold 3 sec - pain should be less than 3/10  BTE done this date - CPM  For flexion and extention alternating - 200 sec  Showed increase of extention by 10 and flexion by 20 degrees  Add hammer holding close to head - 10 reps for flexion and extention              OT Education - 04/06/17 1520    Education provided Yes   Education Details HEP upgraded , progress and  findings discuss   Person(s) Educated Patient;Child(ren)   Methods Explanation;Demonstration;Tactile cues;Verbal cues;Handout   Comprehension Returned demonstration;Verbalized understanding;Verbal cues required          OT Short Term Goals - 04/06/17 1527      OT SHORT TERM GOAL #1   Title AROM in L hand digits improve for pt to touch palm and do opposition to hold onto 1 cm diameter tool , do buttons with more ease    Baseline digits touch palm but tight and feels swollen - and thumb only to 4th digit opposition -   Time 4   Period Weeks   Status On-going     OT SHORT TERM GOAL #2   Title L wrist AROM improve with 15-20 degrees to use hand 50% of ADL's    Baseline Wrist flexion and extnetion slow progress, sup 60 and RD, UD 12 and 22    Time 2   Period Weeks   Status On-going     OT SHORT TERM GOAL #3   Title Pt  to be ind in HEP to decrease scar tissue, numbness , edema and increase ROM and strength to return to use L hand in  ADL's more than 50% of time    Status Achieved           OT Long Term Goals - 04/06/17 1528      OT LONG TERM GOAL #1   Title Function on PRHWE improve with more than 20 points    Baseline Function score at eval 31.5/50 -improving    Time 4   Period Weeks   Status On-going     OT LONG TERM GOAL #2   Title Pain on PRWHE improve with more than 12 points    Baseline pain at eval 21/50  - pain still with PROM RD ,UD -  thumb    Time 4   Period Weeks   Status On-going     OT LONG TERM GOAL #3   Title  L grip strength improve to more than 50% compare  to R hand to return to doing yard and use tools    Baseline Grip R 35 , L 120   Time 4   Period Weeks   Status On-going               Plan - 04/06/17 1521    Clinical Impression Statement Pt arrive this date discourage about numbness in thumb , calcification in dorsal wrist and possible surgery - pt showed increase supination , but flexion and extnetion about the same but during this session done CPM on BTE and pt progress 10-20 degrees - pt to do flexion and extention HEP 3 x day and strengthening - upgrade putty for grip and prehensio n- did show this date increase radial side pain - positive  Finkelstein and tightness- provided stretch - will cont to assess    Occupational performance deficits (Please refer to evaluation for details): ADL's;IADL's;Play;Leisure   Rehab Potential Good   OT Frequency 2x / week   OT Duration 2 weeks   OT Treatment/Interventions Self-care/ADL training;Fluidtherapy;Splinting;Patient/family education;Therapeutic exercises;Contrast Bath;Scar mobilization;Passive range of motion;Manual Therapy;Parrafin;Cryotherapy   Plan assess progress with changes in HEP    Clinical Decision Making Several treatment options, min-mod task modification necessary   OT Home Exercise Plan see pt instruction   Consulted and Agree with Plan of Care Patient      Patient will benefit from skilled therapeutic intervention in order to improve the following deficits and impairments:  Decreased coordination, Decreased range of motion, Impaired flexibility, Increased edema, Pain, Impaired UE functional use, Decreased strength, Decreased scar mobility, Impaired sensation  Visit Diagnosis: Scar condition and fibrosis of skin  Stiffness of left wrist, not elsewhere classified  Muscle weakness (generalized)  Stiffness of left hand, not elsewhere classified  Pain in left wrist    Problem List Patient Active Problem List   Diagnosis Date Noted  . Distal radius fracture, left  01/23/2017    Oletta CohnuPreez, Yesly Gerety OTR/L,CLT 04/06/2017, 3:30 PM  [facility] [address] [place], [place], [address] Phone: [phone]   Fax:  [phone]  Name: Azell DerScott E Killion MRN: [id number] Date of Birth: 11/18/1955

## 2017-04-10 ENCOUNTER — Ambulatory Visit: Payer: Self-pay | Admitting: Occupational Therapy

## 2017-04-10 DIAGNOSIS — L905 Scar conditions and fibrosis of skin: Secondary | ICD-10-CM

## 2017-04-10 DIAGNOSIS — M25642 Stiffness of left hand, not elsewhere classified: Secondary | ICD-10-CM

## 2017-04-10 DIAGNOSIS — M6281 Muscle weakness (generalized): Secondary | ICD-10-CM

## 2017-04-10 DIAGNOSIS — M25632 Stiffness of left wrist, not elsewhere classified: Secondary | ICD-10-CM

## 2017-04-10 DIAGNOSIS — M25532 Pain in left wrist: Secondary | ICD-10-CM

## 2017-04-10 NOTE — Therapy (Signed)
[facility] [address] [place], [address] Phone: [phone]   Fax:  [phone]  Occupational Therapy Treatment  Patient Details  Name: Andrew Huang MRN: [id number] Date of Birth: Oct 24, 1955 Referring Provider: Floyce Stakes  Encounter Date: 04/10/2017      OT End of Session - 04/10/17 1505    Visit Number 8   Number of Visits 12   Date for OT Re-Evaluation 04/11/17   OT Start Time 1401   OT Stop Time 1452   OT Time Calculation (min) 51 min   Activity Tolerance Patient tolerated treatment well   Behavior During Therapy [facility] for tasks assessed/performed      Past Medical History:  Diagnosis Date  . Aortic coarctation   . Bicuspid aortic valve   . Coronary artery disease   . Heart murmur   . Hypercholesteremia   . Hyperlipidemia   . Hyperlipidemia   . Hypertension   . Ischemic cardiomyopathy   . Sleep apnea     Past Surgical History:  Procedure Laterality Date  . CARDIAC CATHETERIZATION     2014 with Stent Placement  . COARCTATION OF AORTA EXCISION     performed when patient was 61yo  . Epicardial Pacemaker Thoracotomy    . ESOPHAGOGASTRODUODENOSCOPY (EGD) WITH PROPOFOL N/A 06/12/2015   Procedure: ESOPHAGOGASTRODUODENOSCOPY (EGD) WITH PROPOFOL;  Surgeon: Wallace Cullens, MD;  Location: [facility] ENDOSCOPY;  Service: Gastroenterology;  Laterality: N/A;  . HERNIA REPAIR     Femoral Hernia  . INSERT / REPLACE / REMOVE PACEMAKER     2014  . OPEN REDUCTION INTERNAL FIXATION (ORIF) DISTAL RADIAL FRACTURE Left 01/23/2017   Procedure: OPEN REDUCTION INTERNAL FIXATION (ORIF) DISTAL RADIAL FRACTURE;  Surgeon: Kennedy Bucker, MD;  Location: [facility] ORS;  Service: Orthopedics;  Laterality: Left;  . REPAIR / RESECT / TRANSPLANT BICEPS TENDON Right   . TEE WITHOUT CARDIOVERSION      There were no vitals filed for this visit.      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 No/denies            [facility] OT Assessment - 04/10/17 0001      AROM   Left Wrist Extension 45 Degrees  60 end   Left Wrist Flexion 35 Degrees  45 end                   OT Treatments/Exercises (OP) - 04/10/17 0001      LUE Paraffin   Number Minutes Paraffin 10 Minutes   LUE Paraffin Location Hand;Wrist   Comments AT soc with wrist in flexion - and last 5 min heatinpad over to increase flexion       Assess thumb ROM , strength and some atrophy in webspace  Less pain this date with Resistance to wrist UD and RD  Pt report numbness in thumb about the same - no improving   But now show tightness Marjean Donna  still - but opposition improve to base of 4th digit Wrist  flexion and extenton, sup improve   pain during ROM on dorsal wrist - and On BTE with resistance pain on volar wrist  Paraffin done  Cont with  putty to teal for grip , lat and 3 point  15 reps 2 x day  Cont with thumb flexion stretch into palm and RD - 1 0reps  Hold 3 sec - pain should be less than 3/10 - done in clinic  BTE done this date - CPM  For flexion and extention alternating - 200 sec x 2  And measured   Sup 2 lbs on BTE for 120 sec  Attempted wrist flexion with tool 701 but pain over CT /volar wrist   Had increase ROM at wrist flexion , extention after CPM  1 lbs weight for wrist flexion and extention 20 reps  Ball on wall 1 kg ball - with wrist , elbow extention - digits extention and - shoulder 90 degrees 30 sec   x 2             OT Education - 04/10/17 1505    Education provided Yes   Education Details HEp same    Person(s) Educated Patient   Methods  Explanation;Demonstration;Tactile cues;Verbal cues   Comprehension Verbal cues required;Returned demonstration;Verbalized understanding          OT Short Term Goals - 04/06/17 1527      OT SHORT TERM GOAL #1   Title AROM in L hand digits improve for pt to touch palm and do opposition to hold onto 1 cm diameter tool , do buttons with more ease    Baseline digits touch palm but tight and feels swollen - and thumb only to 4th digit opposition -   Time 4   Period Weeks   Status On-going     OT SHORT TERM GOAL #2   Title L wrist AROM improve with 15-20 degrees to use hand 50% of ADL's    Baseline Wrist flexion and extnetion slow progress, sup 60 and RD, UD 12 and 22    Time 2   Period Weeks   Status On-going     OT SHORT TERM GOAL #3   Title Pt  to be ind in HEP to decrease scar tissue, numbness , edema and increase ROM and strength to return to use L hand in  ADL's more than 50% of time    Status Achieved           OT Long Term Goals - 04/06/17 1528      OT LONG TERM GOAL #1   Title Function on PRHWE improve with more than 20 points    Baseline Function score at eval 31.5/50 -improving    Time 4   Period Weeks   Status On-going     OT LONG TERM GOAL #2   Title Pain on PRWHE improve with more than 12 points    Baseline pain at eval 21/50  - pain still with PROM RD ,UD - thumb    Time 4   Period Weeks   Status On-going     OT LONG TERM GOAL #3   Title  L grip strength improve to more than 50% compare to R hand to return to doing yard and use tools    Baseline Grip R 35 , L 120   Time 4   Period Weeks   Status On-going               Plan - 04/10/17 1506  Clinical Impression Statement PT showing progress in ROM at wrist and thumb slow but steady - good success with CPM on BTE  - but numbness still same- and when attempted strengthenig nfor flexion of wrist - had pain over volar wrist -    Occupational performance deficits (Please refer to evaluation for  details): ADL's;IADL's;Play;Leisure   Rehab Potential Good   OT Frequency 2x / week   OT Duration 2 weeks   OT Treatment/Interventions Self-care/ADL training;Fluidtherapy;Splinting;Patient/family education;Therapeutic exercises;Contrast Bath;Scar mobilization;Passive range of motion;Manual Therapy;Parrafin;Cryotherapy   Plan assess progress with calcifications  and HEP    Clinical Decision Making Several treatment options, min-mod task modification necessary   OT Home Exercise Plan see pt instruction   Consulted and Agree with Plan of Care Patient      Patient will benefit from skilled therapeutic intervention in order to improve the following deficits and impairments:  Decreased coordination, Decreased range of motion, Impaired flexibility, Increased edema, Pain, Impaired UE functional use, Decreased strength, Decreased scar mobility, Impaired sensation  Visit Diagnosis: Scar condition and fibrosis of skin  Stiffness of left wrist, not elsewhere classified  Muscle weakness (generalized)  Stiffness of left hand, not elsewhere classified  Pain in left wrist    Problem List Patient Active Problem List   Diagnosis Date Noted  . Distal radius fracture, left 01/23/2017    Oletta Cohn OTR/L,CLT 04/10/2017, 4:23 PM  [facility] [address], [place], [address] Phone: [phone]   Fax:  [phone]  Name: Andrew Huang MRN: [id number] Date of Birth: 1955/12/24

## 2017-04-10 NOTE — Patient Instructions (Signed)
Same HEP  

## 2017-04-13 ENCOUNTER — Ambulatory Visit: Payer: Self-pay | Admitting: Occupational Therapy

## 2017-04-13 DIAGNOSIS — M25642 Stiffness of left hand, not elsewhere classified: Secondary | ICD-10-CM

## 2017-04-13 DIAGNOSIS — L905 Scar conditions and fibrosis of skin: Secondary | ICD-10-CM

## 2017-04-13 DIAGNOSIS — M25632 Stiffness of left wrist, not elsewhere classified: Secondary | ICD-10-CM

## 2017-04-13 DIAGNOSIS — M6281 Muscle weakness (generalized): Secondary | ICD-10-CM

## 2017-04-13 DIAGNOSIS — M25532 Pain in left wrist: Secondary | ICD-10-CM

## 2017-04-13 NOTE — Therapy (Signed)
[place] [facility] [address] [place], [address] Phone: [phone]   Fax:  [phone]  Occupational Therapy Treatment  Patient Details  Name: Andrew Huang MRN: [id number] Date of Birth: 08-15-1956 Referring Provider: Floyce Stakes  Encounter Date: 04/13/2017      OT End of Session - 04/13/17 1451    Visit Number 9   Number of Visits 12   Date for OT Re-Evaluation 04/27/17   OT Start Time 1431   OT Stop Time 1516   OT Time Calculation (min) 45 min   Activity Tolerance Patient tolerated treatment well   Behavior During Therapy [facility] for tasks assessed/performed      Past Medical History:  Diagnosis Date  . Aortic coarctation   . Bicuspid aortic valve   . Coronary artery disease   . Heart murmur   . Hypercholesteremia   . Hyperlipidemia   . Hyperlipidemia   . Hypertension   . Ischemic cardiomyopathy   . Sleep apnea     Past Surgical History:  Procedure Laterality Date  . CARDIAC CATHETERIZATION     2014 with Stent Placement  . COARCTATION OF AORTA EXCISION     performed when patient was 61yo  . Epicardial Pacemaker Thoracotomy    . ESOPHAGOGASTRODUODENOSCOPY (EGD) WITH PROPOFOL N/A 06/12/2015   Procedure: ESOPHAGOGASTRODUODENOSCOPY (EGD) WITH PROPOFOL;  Surgeon: Wallace Cullens, MD;  Location[facility] ENDOSCOPY;  Service: Gastroenterology;  Laterality: N/A;  . HERNIA REPAIR     Femoral Hernia  . INSERT / REPLACE / REMOVE PACEMAKER     2014  . OPEN REDUCTION INTERNAL FIXATION (ORIF) DISTAL RADIAL FRACTURE Left 01/23/2017   Procedure: OPEN REDUCTION INTERNAL FIXATION (ORIF) DISTAL RADIAL FRACTURE;  Surgeon: Kennedy Bucker, MD;  Location: [facility] ORS;  Service: Orthopedics;  Laterality: Left;  . REPAIR / RESECT / TRANSPLANT BICEPS TENDON Right   . TEE WITHOUT CARDIOVERSION      There were no vitals filed for this visit.      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 Yes   Pain Score 4    Pain Location Wrist   Pain Orientation Left   Pain Descriptors / Indicators Aching   Pain Type Surgical pain            [facility] OT Assessment - 04/13/17 0001      AROM   Left Wrist Extension 53 Degrees   Left Wrist Flexion 38 Degrees                  OT Treatments/Exercises (OP) - 04/13/17 0001      LUE Contrast Bath   Time 11 minutes   Comments at [facility] to increase ROM and decrease pain     Pt report numbness in thumb about the same - no improving   Tightness and pain over radial head and thumb flexion improving  Wrist flexion and extenton assess   Contrast done   soft tissue mobs to forearm dorsal and volar - CT spreads nad MC spreads and MC joint mobs with traction gentle prior to ROM  Cont  with  putty to teal for grip , lat and 3 point  15 reps 2 x day at home   Cont with thumb flexion stretch into palm and RD - 1 0reps Hold 3 sec - pain should be less than 3/10 - done in clinic  BTE done this date - CPM For flexion and extention alternating - 200 sec x 2  And measured   Held off on any strengthening like last time              OT Education - 04/13/17 1451    Education provided Yes   Education Details HEP    Person(s) Educated Patient   Methods Explanation;Demonstration;Tactile cues;Verbal cues   Comprehension Verbal cues required;Returned demonstration;Verbalized understanding          OT Short Term Goals - 04/06/17 1527      OT SHORT TERM GOAL #1   Title AROM in L hand digits improve for pt to touch palm and do opposition to hold onto 1 cm diameter tool , do buttons with more ease    Baseline digits touch palm but tight and feels swollen - and thumb only to  4th digit opposition -   Time 4   Period Weeks   Status On-going     OT SHORT TERM GOAL #2   Title L wrist AROM improve with 15-20 degrees to use hand 50% of ADL's    Baseline Wrist flexion and extnetion slow progress, sup 60 and RD, UD 12 and 22    Time 2   Period Weeks   Status On-going     OT SHORT TERM GOAL #3   Title Pt  to be ind in HEP to decrease scar tissue, numbness , edema and increase ROM and strength to return to use L hand in  ADL's more than 50% of time    Status Achieved           OT Long Term Goals - 04/06/17 1528      OT LONG TERM GOAL #1   Title Function on PRHWE improve with more than 20 points    Baseline Function score at eval 31.5/50 -improving    Time 4   Period Weeks   Status On-going     OT LONG TERM GOAL #2   Title Pain on PRWHE improve with more than 12 points    Baseline pain at eval 21/50  - pain still with PROM RD ,UD - thumb    Time 4   Period Weeks   Status On-going     OT LONG TERM GOAL #3   Title  L grip strength improve to more than 50% compare to R hand to return to doing yard and use tools    Baseline Grip R 35 , L 120   Time 4   Period Weeks   Status On-going               Plan - 04/13/17 1454    Clinical Impression Statement Pt report after last time he was sore for about 24 to 48 hrs- more tightness or swelling in hand - pt did do CPM last time 2x and strenghtning more than usual - this date did soft tissue and CPM for wrist flexion and extention -  pt to ocnt wtih HEP and will cont CPM next week    Occupational performance deficits (Please refer to evaluation for details): ADL's;IADL's;Play;Leisure   Rehab Potential Good   OT Frequency 2x / week  OT Duration 4 weeks   OT Treatment/Interventions Self-care/ADL training;Fluidtherapy;Splinting;Patient/family education;Therapeutic exercises;Contrast Bath;Scar mobilization;Passive range of motion;Manual Therapy;Parrafin;Cryotherapy   Plan cont CPM - without increase pain     Clinical Decision Making Several treatment options, min-mod task modification necessary   OT Home Exercise Plan see pt instruction   Consulted and Agree with Plan of Care Patient      Patient will benefit from skilled therapeutic intervention in order to improve the following deficits and impairments:  Decreased coordination, Decreased range of motion, Impaired flexibility, Increased edema, Pain, Impaired UE functional use, Decreased strength, Decreased scar mobility, Impaired sensation  Visit Diagnosis: Scar condition and fibrosis of skin  Muscle weakness (generalized)  Stiffness of left wrist, not elsewhere classified  Stiffness of left hand, not elsewhere classified  Pain in left wrist    Problem List Patient Active Problem List   Diagnosis Date Noted  . Distal radius fracture, left 01/23/2017    Oletta Cohn OTR/L,CLT  04/13/2017, 5:20 PM  [facility] [address] [place], [address] Phone: [phone]   Fax:  [phone]  Name: Andrew Huang MRN: [id number] Date of Birth: 1955-10-13

## 2017-04-13 NOTE — Patient Instructions (Addendum)
Same HEP  

## 2017-04-18 ENCOUNTER — Ambulatory Visit: Payer: Self-pay | Admitting: Occupational Therapy

## 2017-04-18 DIAGNOSIS — M25642 Stiffness of left hand, not elsewhere classified: Secondary | ICD-10-CM

## 2017-04-18 DIAGNOSIS — M25532 Pain in left wrist: Secondary | ICD-10-CM

## 2017-04-18 DIAGNOSIS — M25632 Stiffness of left wrist, not elsewhere classified: Secondary | ICD-10-CM

## 2017-04-18 DIAGNOSIS — L905 Scar conditions and fibrosis of skin: Secondary | ICD-10-CM

## 2017-04-18 DIAGNOSIS — M6281 Muscle weakness (generalized): Secondary | ICD-10-CM

## 2017-04-18 NOTE — Patient Instructions (Signed)
COnt with same HEP to maintain wrist ROM and thumb until appt with MD  Cont with putty

## 2017-04-18 NOTE — Therapy (Signed)
[place] [facility] [address], [place], [address] Phone: [phone]   Fax:  [phone]  Occupational Therapy Treatment  Patient Details  Name: Andrew Huang MRN: [id number] Date of Birth: 1956/03/25 Referring Provider: Floyce Stakes  Encounter Date: 04/18/2017      OT End of Session - 04/18/17 1516    Visit Number 10   Number of Visits 12   Date for OT Re-Evaluation 04/27/17   OT Start Time 1445   OT Stop Time 1510   OT Time Calculation (min) 25 min   Activity Tolerance Patient tolerated treatment well   Behavior During Therapy [facility] for tasks assessed/performed      Past Medical History:  Diagnosis Date  . Aortic coarctation   . Bicuspid aortic valve   . Coronary artery disease   . Heart murmur   . Hypercholesteremia   . Hyperlipidemia   . Hyperlipidemia   . Hypertension   . Ischemic cardiomyopathy   . Sleep apnea     Past Surgical History:  Procedure Laterality Date  . CARDIAC CATHETERIZATION     2014 with Stent Placement  . COARCTATION OF AORTA EXCISION     performed when patient was 61yo  . Epicardial Pacemaker Thoracotomy    . ESOPHAGOGASTRODUODENOSCOPY (EGD) WITH PROPOFOL N/A 06/12/2015   Procedure: ESOPHAGOGASTRODUODENOSCOPY (EGD) WITH PROPOFOL;  Surgeon: Wallace Cullens, MD;  Location: [facility] ENDOSCOPY;  Service: Gastroenterology;  Laterality: N/A;  . HERNIA REPAIR     Femoral Hernia  . INSERT / REPLACE / REMOVE PACEMAKER     2014  . OPEN REDUCTION INTERNAL FIXATION (ORIF) DISTAL RADIAL FRACTURE Left 01/23/2017   Procedure: OPEN REDUCTION INTERNAL FIXATION (ORIF) DISTAL RADIAL FRACTURE;  Surgeon: Kennedy Bucker, MD;  Location: [facility] ORS;  Service: Orthopedics;  Laterality: Left;  . REPAIR / RESECT / TRANSPLANT BICEPS TENDON Right   . TEE WITHOUT CARDIOVERSION      There were no vitals filed for this visit.      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 Yes   Pain Score 2    Pain Location Wrist   Pain Orientation Left   Pain Descriptors / Indicators Aching            [facility] OT Assessment - 04/18/17 0001      AROM   Left Forearm Supination 60 Degrees   Left Wrist Extension 53 Degrees   Left Wrist Flexion 35 Degrees   Left Wrist Radial Deviation 8 Degrees   Left Wrist Ulnar Deviation 24 Degrees     Strength   Left Hand Grip (lbs) 45   Left Hand Lateral Pinch 16 lbs   Left Hand 3 Point Pinch 10 lbs       Assess ROM , grip and prehension in L hand and wrist  Thumb flexion and opposition improved  But pain limiting progress in flexion -  And strengthening increase pain on ulnar and volar wrist - ? CTS  Numbness cont to be same in thumb  Discuss with pt to cont at home with HEP for ROM  And putty until appt with Dr Rosita Kea  Pt in agreement                     OT Education - 04/18/17 1516    Education provided Yes   Education Details progress and findings    Person(s) Educated Patient   Methods Explanation   Comprehension Verbalized understanding;Returned demonstration          OT Short Term Goals - 04/06/17 1527      OT SHORT TERM GOAL #1   Title AROM in L hand digits improve for pt to touch palm and do opposition to hold onto 1 cm diameter tool , do buttons with more ease    Baseline digits touch palm but tight and feels swollen - and thumb only to 4th digit opposition -   Time 4   Period Weeks   Status On-going     OT SHORT TERM GOAL #2   Title L wrist AROM improve with 15-20 degrees to use hand 50% of ADL's    Baseline Wrist flexion and extnetion slow progress, sup 60 and RD, UD 12 and 22    Time 2   Period Weeks    Status On-going     OT SHORT TERM GOAL #3   Title Pt  to be ind in HEP to decrease scar tissue, numbness , edema and increase ROM and strength to return to use L hand in  ADL's more than 50% of time    Status Achieved           OT Long Term Goals - 04/06/17 1528      OT LONG TERM GOAL #1   Title Function on PRHWE improve with more than 20 points    Baseline Function score at eval 31.5/50 -improving    Time 4   Period Weeks   Status On-going     OT LONG TERM GOAL #2   Title Pain on PRWHE improve with more than 12 points    Baseline pain at eval 21/50  - pain still with PROM RD ,UD - thumb    Time 4   Period Weeks   Status On-going     OT LONG TERM GOAL #3   Title  L grip strength improve to more than 50% compare to R hand to return to doing yard and use tools    Baseline Grip R 35 , L 120   Time 4   Period Weeks   Status On-going               Plan - 04/18/17 1517    Clinical Impression Statement Assess pt's ROM and grip /prehension - pt showed lsat 2 wks 20 degrees progress in extention of wrist - but flexion and supination about same - pain at dorsal wrist , grip improved and thumb ROM -  but numbness same - recommend for pt to cont at home with HEP until appt with Dr Rosita Kea  next week    Occupational performance deficits (Please refer to evaluation for details): ADL's;IADL's;Play;Leisure   Rehab Potential Fair   OT Treatment/Interventions Self-care/ADL training;Fluidtherapy;Splinting;Patient/family education;Therapeutic exercises;Contrast Bath;Scar mobilization;Passive range of motion;Manual Therapy;Parrafin;Cryotherapy   Plan cont with HEP until appt with Dr Rosita Kea   Clinical Decision Making Several treatment options, min-mod task modification necessary   OT Home Exercise Plan see pt instruction   Consulted and Agree with Plan of Care Patient      Patient will benefit from skilled therapeutic intervention  in order to improve the following deficits and  impairments:  Decreased coordination, Decreased range of motion, Impaired flexibility, Increased edema, Pain, Impaired UE functional use, Decreased strength, Decreased scar mobility, Impaired sensation  Visit Diagnosis: Scar condition and fibrosis of skin  Muscle weakness (generalized)  Stiffness of left wrist, not elsewhere classified  Stiffness of left hand, not elsewhere classified  Pain in left wrist    Problem List Patient Active Problem List   Diagnosis Date Noted  . Distal radius fracture, left 01/23/2017    Oletta Cohn OTR/L,CLT  04/18/2017, 3:19 PM  [facility] [address] [place], [address] Phone: [phone]   Fax:  [phone]  Name: ALANSON HAUSMANN MRN: [id number] Date of Birth: September 06, 1955

## 2017-04-20 ENCOUNTER — Ambulatory Visit: Payer: Self-pay | Admitting: Occupational Therapy

## 2017-10-14 IMAGING — DX DG WRIST 2V*L*
2 series · 2 of 2 positions shown · non-contrast
Comparison: Radiographs dated 01/07/2017

CLINICAL DATA: Distal radius fracture.

EXAM:
LEFT WRIST - 2 VIEW

[wrist ap]
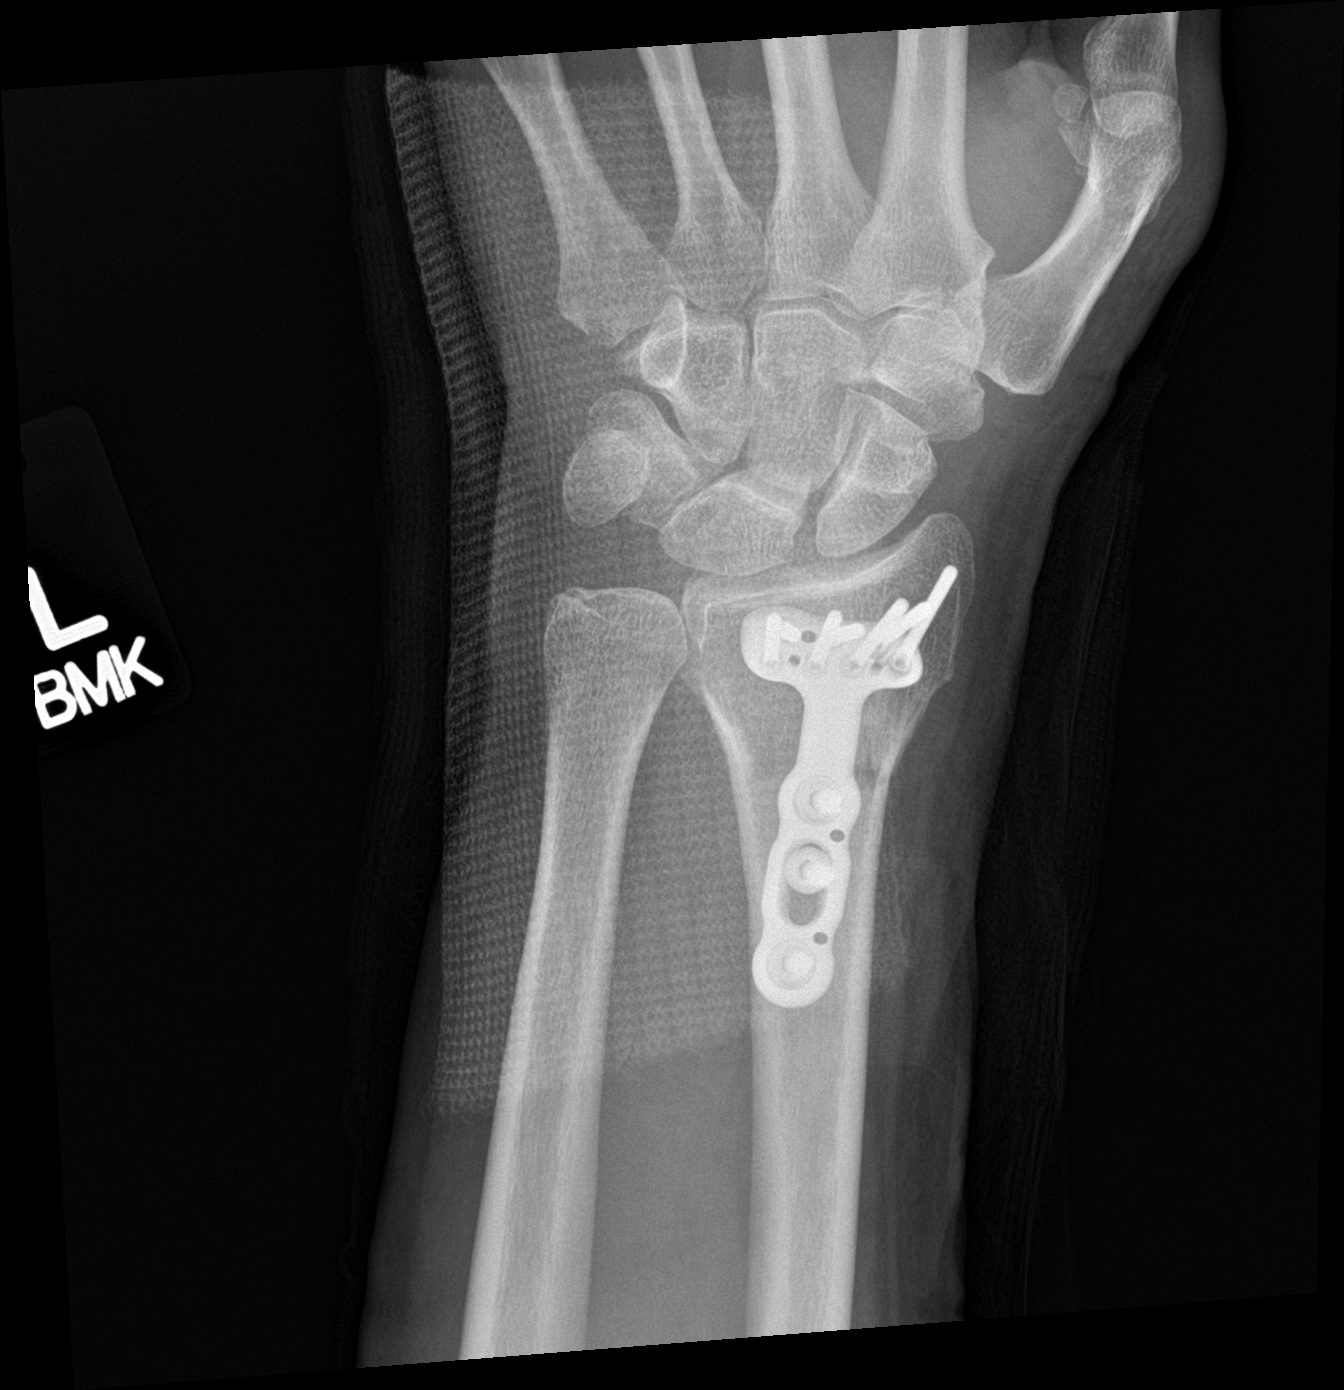

[wrist lat]
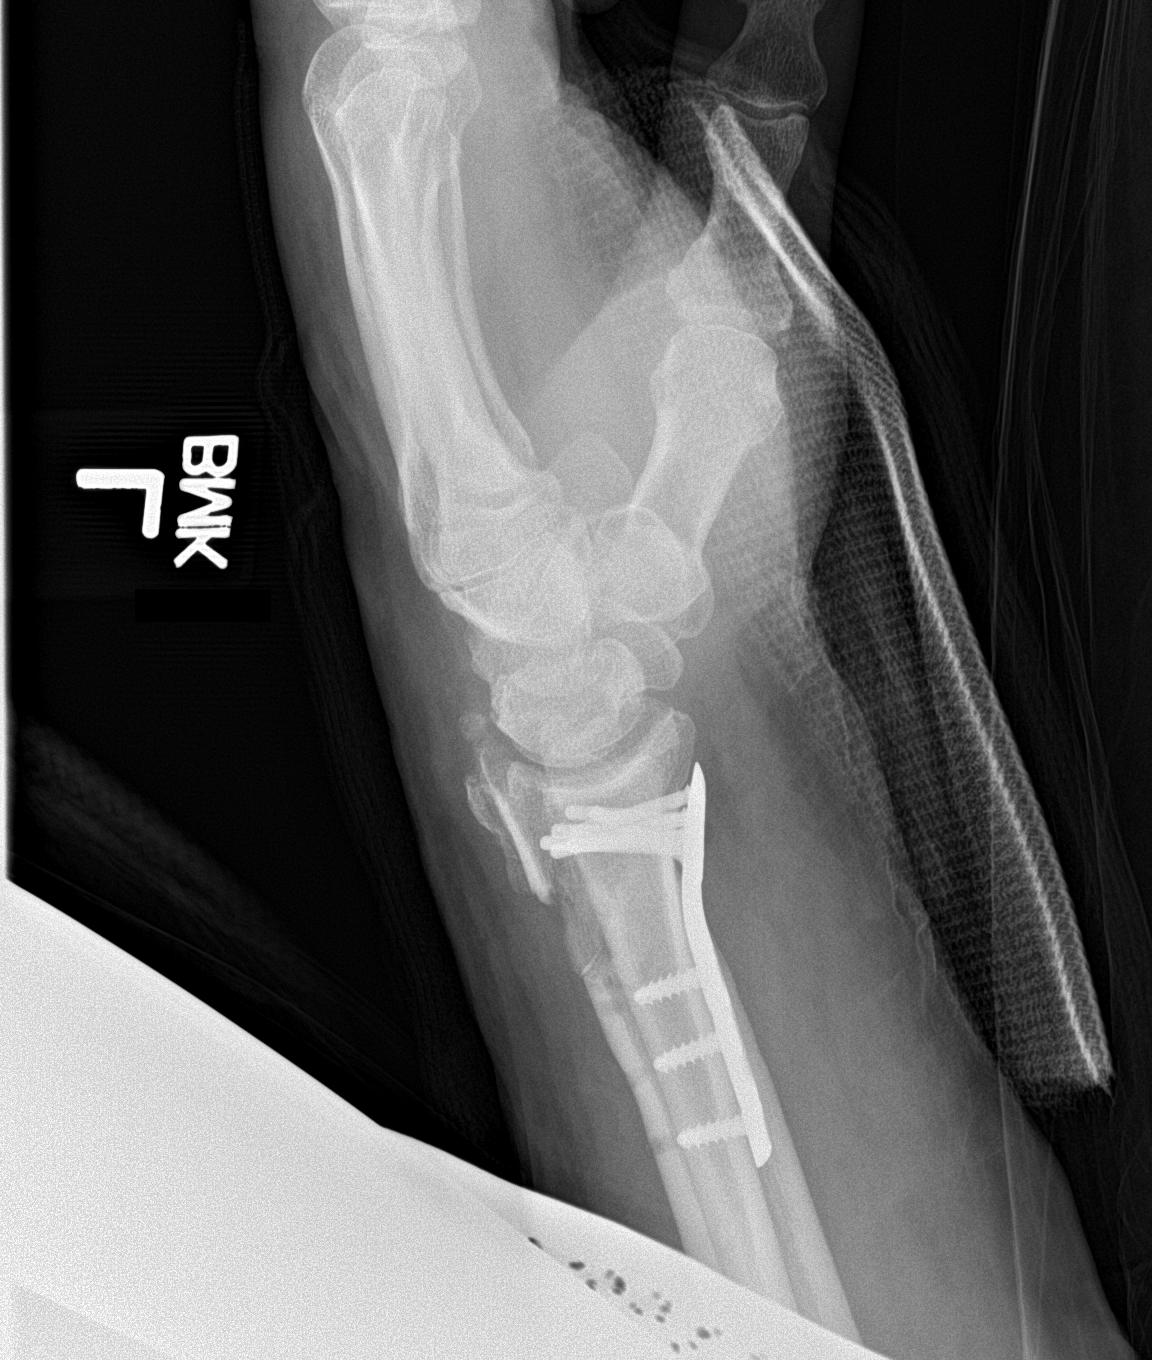

[2 of 2 positions shown; findings below may reference images not displayed]

FINDINGS: The patient has undergone open reduction and internal fixation of
the comminuted impacted fracture of the distal left radius.
Alignment of the major fracture fragments is essentially anatomic.
Plate and multiple screws have been inserted.
IMPRESSION: Essentially anatomic alignment and position of the major fracture
fragments after open reduction and internal fixation.

## 2018-10-10 ENCOUNTER — Other Ambulatory Visit: Payer: Self-pay

## 2018-10-10 ENCOUNTER — Emergency Department
Admission: EM | Admit: 2018-10-10 | Discharge: 2018-10-10 | Disposition: A | Discharge status: Home / Self Care | Payer: Self-pay | Attending: Emergency Medicine | Admitting: Emergency Medicine

## 2018-10-10 ENCOUNTER — Emergency Department: Payer: Self-pay

## 2018-10-10 DIAGNOSIS — Z7982 Long term (current) use of aspirin: Secondary | ICD-10-CM | POA: Insufficient documentation

## 2018-10-10 DIAGNOSIS — I10 Essential (primary) hypertension: Secondary | ICD-10-CM | POA: Insufficient documentation

## 2018-10-10 DIAGNOSIS — Z7902 Long term (current) use of antithrombotics/antiplatelets: Secondary | ICD-10-CM | POA: Insufficient documentation

## 2018-10-10 DIAGNOSIS — Z87891 Personal history of nicotine dependence: Secondary | ICD-10-CM | POA: Insufficient documentation

## 2018-10-10 DIAGNOSIS — Z79899 Other long term (current) drug therapy: Secondary | ICD-10-CM | POA: Insufficient documentation

## 2018-10-10 DIAGNOSIS — I2510 Atherosclerotic heart disease of native coronary artery without angina pectoris: Secondary | ICD-10-CM | POA: Insufficient documentation

## 2018-10-10 DIAGNOSIS — J181 Lobar pneumonia, unspecified organism: Secondary | ICD-10-CM

## 2018-10-10 DIAGNOSIS — Z8679 Personal history of other diseases of the circulatory system: Secondary | ICD-10-CM | POA: Insufficient documentation

## 2018-10-10 DIAGNOSIS — Z952 Presence of prosthetic heart valve: Secondary | ICD-10-CM | POA: Insufficient documentation

## 2018-10-10 DIAGNOSIS — J189 Pneumonia, unspecified organism: Secondary | ICD-10-CM | POA: Insufficient documentation

## 2018-10-10 LAB — INFLUENZA PANEL BY PCR (TYPE A & B)
INFLAPCR: NEGATIVE
INFLBPCR: NEGATIVE

## 2018-10-10 LAB — CBC WITH DIFFERENTIAL/PLATELET
ABS IMMATURE GRANULOCYTES: 0.04 10*3/uL (ref 0.00–0.07)
BASOS ABS: 0 10*3/uL (ref 0.0–0.1)
Basophils Relative: 0 %
EOS PCT: 1 %
Eosinophils Absolute: 0.1 10*3/uL (ref 0.0–0.5)
HEMATOCRIT: 46.9 % (ref 39.0–52.0)
HEMOGLOBIN: 15.7 g/dL (ref 13.0–17.0)
IMMATURE GRANULOCYTES: 0 %
LYMPHS ABS: 1.6 10*3/uL (ref 0.7–4.0)
LYMPHS PCT: 16 %
MCH: 31.5 pg (ref 26.0–34.0)
MCHC: 33.5 g/dL (ref 30.0–36.0)
MCV: 94.2 fL (ref 80.0–100.0)
Monocytes Absolute: 1.4 10*3/uL — ABNORMAL HIGH (ref 0.1–1.0)
Monocytes Relative: 13 %
NEUTROS ABS: 7.1 10*3/uL (ref 1.7–7.7)
NEUTROS PCT: 70 %
NRBC: 0 % (ref 0.0–0.2)
Platelets: 229 10*3/uL (ref 150–400)
RBC: 4.98 MIL/uL (ref 4.22–5.81)
RDW: 12.4 % (ref 11.5–15.5)
WBC: 10.3 10*3/uL (ref 4.0–10.5)

## 2018-10-10 LAB — COMPREHENSIVE METABOLIC PANEL
ALT: 45 U/L — ABNORMAL HIGH (ref 0–44)
ANION GAP: 8 (ref 5–15)
AST: 30 U/L (ref 15–41)
Albumin: 4.1 g/dL (ref 3.5–5.0)
Alkaline Phosphatase: 72 U/L (ref 38–126)
BILIRUBIN TOTAL: 0.9 mg/dL (ref 0.3–1.2)
BUN: 13 mg/dL (ref 6–20)
CO2: 26 mmol/L (ref 22–32)
CREATININE: 1.17 mg/dL (ref 0.61–1.24)
Calcium: 8.9 mg/dL (ref 8.9–10.3)
Chloride: 104 mmol/L (ref 98–111)
GFR calc Af Amer: >60 mL/min (ref >60)
Glucose, Bld: 111 mg/dL — ABNORMAL HIGH (ref 70–99)
POTASSIUM: 4.2 mmol/L (ref 3.5–5.1)
Sodium: 138 mmol/L (ref 135–145)
Total Protein: 7.9 g/dL (ref 6.5–8.1)

## 2018-10-10 LAB — LACTIC ACID, PLASMA
LACTIC ACID, VENOUS: 2 mmol/L — AB (ref 0.5–1.9)
LACTIC ACID, VENOUS: 1.1 mmol/L (ref 0.5–1.9)

## 2018-10-10 LAB — TROPONIN I: Troponin I: <0.03 ng/mL (ref <0.03)

## 2018-10-10 MED ORDER — SODIUM CHLORIDE 0.9% FLUSH: 3.0000 mL | Freq: Once | INTRAVENOUS | Status: DC

## 2018-10-10 MED ORDER — SODIUM CHLORIDE 0.9 % IV BOLUS
500.0000 mL | Freq: Once | INTRAVENOUS | Status: AC
  Administered 2018-10-10: 500 mL via INTRAVENOUS

## 2018-10-10 MED ORDER — AZITHROMYCIN 250 MG PO TABS: ORAL_TABLET | ORAL | 0 refills | Status: AC

## 2018-10-10 MED ORDER — ACETAMINOPHEN 500 MG PO TABS
1000.0000 mg | ORAL_TABLET | Freq: Once | ORAL | Status: AC
  Administered 2018-10-10: 1000 mg via ORAL
  Filled 2018-10-10: qty 2

## 2018-10-10 NOTE — ED Notes (Signed)
Md at bedside to discuss discharge intructions and follow up. VSS. IV hep lock d/c

## 2018-10-10 NOTE — ED Notes (Signed)
Pt provided sandwich tray at this time  

## 2018-10-10 NOTE — ED Notes (Signed)
Lab will come to stick pt for 2nd set of Blood cultures at thsi time

## 2018-10-10 NOTE — ED Notes (Signed)
Patient taken to Triage room for EKG.

## 2018-10-10 NOTE — ED Provider Notes (Signed)
[facility] Emergency Department Provider Note ____________________________________________   First MD Initiated Contact with Patient 10/10/18 1241     (approximate)  I have reviewed the triage vital signs and the nursing notes.   HISTORY  Chief Complaint Pleurisy and Fever    HPI Andrew Huang is a 63 y.o. male with PMH as noted below including aortic valve disease status post valve repair in 2014 as well as a history of CAD who presents with low-grade fever, measured to around 100.3 at home, occurring over the last 2 days, as well as with intermittent sharp right-sided chest wall pain worse with certain positions and movement, as well as with body aches.  The patient denies shortness of breath, cough, vomiting or diarrhea, or abdominal pain.  Past Medical History:  Diagnosis Date  . Aortic coarctation   . Bicuspid aortic valve   . Coronary artery disease   . Heart murmur   . Hypercholesteremia   . Hyperlipidemia   . Hyperlipidemia   . Hypertension   . Ischemic cardiomyopathy   . Sleep apnea     Patient Active Problem List   Diagnosis Date Noted  . Distal radius fracture, left 01/23/2017    Past Surgical History:  Procedure Laterality Date  . CARDIAC CATHETERIZATION     2014 with Stent Placement  . COARCTATION OF AORTA EXCISION     performed when patient was 63yo  . Epicardial Pacemaker Thoracotomy    . ESOPHAGOGASTRODUODENOSCOPY (EGD) WITH PROPOFOL N/A 06/12/2015   Procedure: ESOPHAGOGASTRODUODENOSCOPY (EGD) WITH PROPOFOL;  Surgeon: Wallace Cullens, MD;  Location: [facility] ENDOSCOPY;  Service: Gastroenterology;  Laterality: N/A;  . HERNIA REPAIR     Femoral Hernia  . INSERT / REPLACE / REMOVE PACEMAKER     2014  . OPEN REDUCTION INTERNAL FIXATION (ORIF) DISTAL RADIAL FRACTURE Left 01/23/2017   Procedure: OPEN REDUCTION INTERNAL FIXATION (ORIF) DISTAL RADIAL FRACTURE;  Surgeon: Kennedy Bucker, MD;  Location: [facility] ORS;  Service: Orthopedics;   Laterality: Left;  . REPAIR / RESECT / TRANSPLANT BICEPS TENDON Right   . TEE WITHOUT CARDIOVERSION      Prior to Admission medications   Medication Sig Start Date End Date Taking? Authorizing Provider  albuterol (PROVENTIL HFA;VENTOLIN HFA) 108 (90 BASE) MCG/ACT inhaler Inhale 2 puffs into the lungs every 6 (six) hours as needed for wheezing or shortness of breath.    [provider]  aspirin EC 81 MG tablet Take 81 mg by mouth daily.    [provider]  atorvastatin (LIPITOR) 40 MG tablet Take 40 mg by mouth daily.    [provider]  azithromycin (ZITHROMAX Z-PAK) 250 MG tablet Take 2 tablets (500 mg) on  Day 1,  followed by 1 tablet (250 mg) once daily on Days 2 through 5. 10/10/18 10/15/18  Dionne Bucy, MD  benazepril (LOTENSIN) 20 MG tablet Take 20 mg by mouth daily.    [provider]  ciprofloxacin (CIPRO) 500 MG tablet Take 1 tablet (500 mg total) by mouth 2 (two) times daily. Patient not taking: Reported on 01/23/2017 09/29/16   Hassan Rowan, MD  clopidogrel (PLAVIX) 75 MG tablet Take 75 mg by mouth daily.    [provider]  docusate sodium (COLACE) 100 MG capsule Take 1 tablet once or twice daily as needed for constipation while taking narcotic pain medicine Patient not taking: Reported on 01/23/2017 01/07/17   Loleta Rose, MD  esomeprazole (NEXIUM) 40 MG capsule Take 40 mg by mouth daily at  12 noon.    [provider]  Fluticasone-Salmeterol (ADVAIR) 100-50 MCG/DOSE AEPB Inhale 1 puff into the lungs 2 (two) times daily.    [provider]  hydrochlorothiazide (HYDRODIURIL) 12.5 MG tablet Take 12.5 mg by mouth daily.    [provider]  HYDROcodone-acetaminophen (NORCO) 5-325 MG tablet Take 1-2 tablets by mouth every 6 (six) hours as needed for moderate pain. 01/23/17   Kennedy BuckerMenz, Michael, MD  HYDROcodone-acetaminophen (NORCO/VICODIN) 5-325 MG tablet Take 1-2 tablets by mouth every 4 (four) hours as needed for moderate  pain. 01/24/17   Evon SlackGaines, Thomas C, PA-C  metroNIDAZOLE (FLAGYL) 500 MG tablet Take 1 tablet (500 mg total) by mouth 2 (two) times daily. Patient not taking: Reported on 01/23/2017 09/29/16   Hassan RowanWade, Eugene, MD  OMEGA-3 FATTY ACIDS PO Take 1 capsule by mouth daily.    [provider]  sucralfate (CARAFATE) 1 G tablet Take 1 g by mouth 4 (four) times daily -  with meals and at bedtime.    [provider]    Allergies Patient has no known allergies.  No family history on file.  Social History Social History   Tobacco Use  . Smoking status: Former Smoker    Last attempt to quit: 08/22/1986    Years since quitting: 32.1  . Smokeless tobacco: Never Used  Substance Use Topics  . Alcohol use: No  . Drug use: No    Review of Systems  Constitutional: Positive for fever. Eyes: No redness. ENT: No sore throat. Cardiovascular: Positive for chest pain. Respiratory: Denies shortness of breath. Gastrointestinal: No vomiting or diarrhea.  Genitourinary: Negative for dysuria.  Musculoskeletal: Negative for back pain. Skin: Negative for rash. Neurological: Negative for headache.   ____________________________________________   PHYSICAL EXAM:  VITAL SIGNS: ED Triage Vitals  Enc Vitals Group     BP 10/10/18 1044 119/83     Pulse Rate 10/10/18 1043 95     Resp 10/10/18 1044 17     Temp 10/10/18 1043 100.1 F (37.8 C)     Temp Source 10/10/18 1043 Oral     SpO2 10/10/18 1044 95 %     Weight 10/10/18 1045 245 lb (111.1 kg)     Height 10/10/18 1045 5' 10.5" (1.791 m)     Head Circumference --      Peak Flow --      Pain Score 10/10/18 1044 2     Pain Loc --      Pain Edu? --      Excl. in GC? --     Constitutional: Alert and oriented.  Slightly uncomfortable appearing but in no acute distress. Eyes: Conjunctivae are normal.  Head: Atraumatic. Nose: No congestion/rhinnorhea. Mouth/Throat: Mucous membranes are somewhat dry.   Neck: Normal range of motion.    Cardiovascular: Normal rate, regular rhythm. Grossly normal heart sounds.  Good peripheral circulation. Respiratory: Normal respiratory effort.  No retractions. Lungs CTAB. Gastrointestinal: Soft and nontender. No distention.  Genitourinary: No flank tenderness. Musculoskeletal: No lower extremity edema.  Extremities warm and well perfused.  Neurologic:  Normal speech and language. No gross focal neurologic deficits are appreciated.  Skin:  Skin is warm and dry. No rash noted. Psychiatric: Mood and affect are normal. Speech and behavior are normal.  ____________________________________________   LABS (all labs ordered are listed, but only abnormal results are displayed)  Labs Reviewed  COMPREHENSIVE METABOLIC PANEL - Abnormal; Notable for the following components:      Result Value   Glucose, Bld  111 (*)    ALT 45 (*)    All other components within normal limits  CBC WITH DIFFERENTIAL/PLATELET - Abnormal; Notable for the following components:   Monocytes Absolute 1.4 (*)    All other components within normal limits  LACTIC ACID, PLASMA - Abnormal; Notable for the following components:   Lactic Acid, Venous 2.0 (*)    All other components within normal limits  CULTURE, BLOOD (ROUTINE X 2)  CULTURE, BLOOD (ROUTINE X 2)  TROPONIN I  LACTIC ACID, PLASMA  INFLUENZA PANEL BY PCR (TYPE A & B)   ____________________________________________  EKG  ED ECG REPORT I, Dionne BucySebastian Tuesday Terlecki, the attending physician, personally viewed and interpreted this ECG.  Date: 10/10/2018 EKG Time: 1047 Rate: 102 Rhythm: Sinus tachycardia Intervals: RBBB ST/T Wave abnormalities: normal Narrative Interpretation: no evidence of acute ischemia; no significant change when compared to EKG of 08/12/2013  ____________________________________________  RADIOLOGY  CXR: Possible density left base  ____________________________________________   PROCEDURES  Procedure(s) performed:  No  Procedures  Critical Care performed: No ____________________________________________   INITIAL IMPRESSION / ASSESSMENT AND PLAN / ED COURSE  Pertinent labs & imaging results that were available during my care of the patient were reviewed by me and considered in my medical decision making (see chart for details).  63 year old male with PMH as noted above including CAD and aortic valve replacement in 2014 who presents with low-grade fever over the last 2 days, associated with body aches as well as with right-sided sharp intermittent chest pain.  I reviewed the past medical records in Epic.  The patient has had no ED visits or admissions within the last year.  He had aortic valve replacement with bioprosthetic valve in 2014.  On exam today, the patient is somewhat uncomfortable but relatively well-appearing.  He has a low-grade temperature but his vital signs are otherwise normal.  The remainder of the exam is relatively unremarkable.  Lungs are clear.  He has no significant edema.    Given the presence of low-grade fever, my main differential includes influenza or other viral syndrome, bronchitis, or less likely pneumonia.  The patient is concerned about an infection of his valve.  However, he has no history of recent dental work or other specific risk factors for endocarditis.  Initial lab work-up from triage is reassuring.  Chest x-ray shows a possible opacity in the left chest although it is very faint and given the lack of cough or shortness of breath I have a lower suspicion for pneumonia.  We will obtain troponin, lactic acid, and blood cultures and reassess.  ----------------------------------------- 15:30 PM on 10/10/2018 -----------------------------------------  Influenza is negative.  Initial lactate was minimally elevated but repeat after 500 cc of NS is normal.  There is no indication for repeat troponin given the duration of the symptoms.  The patient's vital signs are  stable.  At this time, there is no indication for admission and the patient wants to go home.  I counseled him that any infection related to his heart valve or bacteremia would be extremely unusual since he does not have a specific precipitating cause.  I suspect that his symptoms are most likely related to the possible pneumonia seen on chest x-ray, versus viral syndrome.  I will treat empirically with azithromycin for CAP.  I counseled the patient extensively on the results of the work-up and the plan of care.  Return precautions given, and he expressed understanding. ____________________________________________   FINAL CLINICAL IMPRESSION(S) / ED DIAGNOSES  Final  diagnoses:  Community acquired pneumonia of left lower lobe of lung (HCC)      NEW MEDICATIONS STARTED DURING THIS VISIT:  Discharge Medication List as of 10/10/2018  3:33 PM    START taking these medications   Details  azithromycin (ZITHROMAX Z-PAK) 250 MG tablet Take 2 tablets (500 mg) on  Day 1,  followed by 1 tablet (250 mg) once daily on Days 2 through 5., Normal         Note:  This document was prepared using Dragon voice recognition software and may include unintentional dictation errors.    Dionne Bucy, MD 10/10/18 2013

## 2018-10-10 NOTE — ED Notes (Signed)
First Nurse Note: Patient complaining of weakness, temp 100 at home, body aches, chest pain.  States he has aortic valve replaced in 2014.

## 2018-10-10 NOTE — Discharge Instructions (Addendum)
Take the antibiotic as prescribed and finish the full course.  You may continue to take tylenol for fever and/or pain.  Follow up with your regular doctor.  We will call you if the blood cultures are positive.    Return to the ER for new, worsening or persistent fever, pain, weakness, shortness of breath, or any other new or worsening symptoms that concern you.

## 2018-10-10 NOTE — ED Triage Notes (Addendum)
Pt c/o generalized bodyaches with low grade fever and chest wall pain with movement and breathing, generalized fatigue/weakness. Denies cough or URI sx, denies N/V/D or abd pain.. pt has heart valve replacement

## 2018-10-15 LAB — CULTURE, BLOOD (ROUTINE X 2)
CULTURE: NO GROWTH
CULTURE: NO GROWTH
SPECIAL REQUESTS: ADEQUATE
Special Requests: ADEQUATE

## 2019-07-01 IMAGING — CR DG CHEST 2V
2 series · 2 of 2 positions shown · non-contrast
Comparison: 08/12/2013

CLINICAL DATA: Chest pain and fever.

EXAM:
CHEST - 2 VIEW

[chest pa]
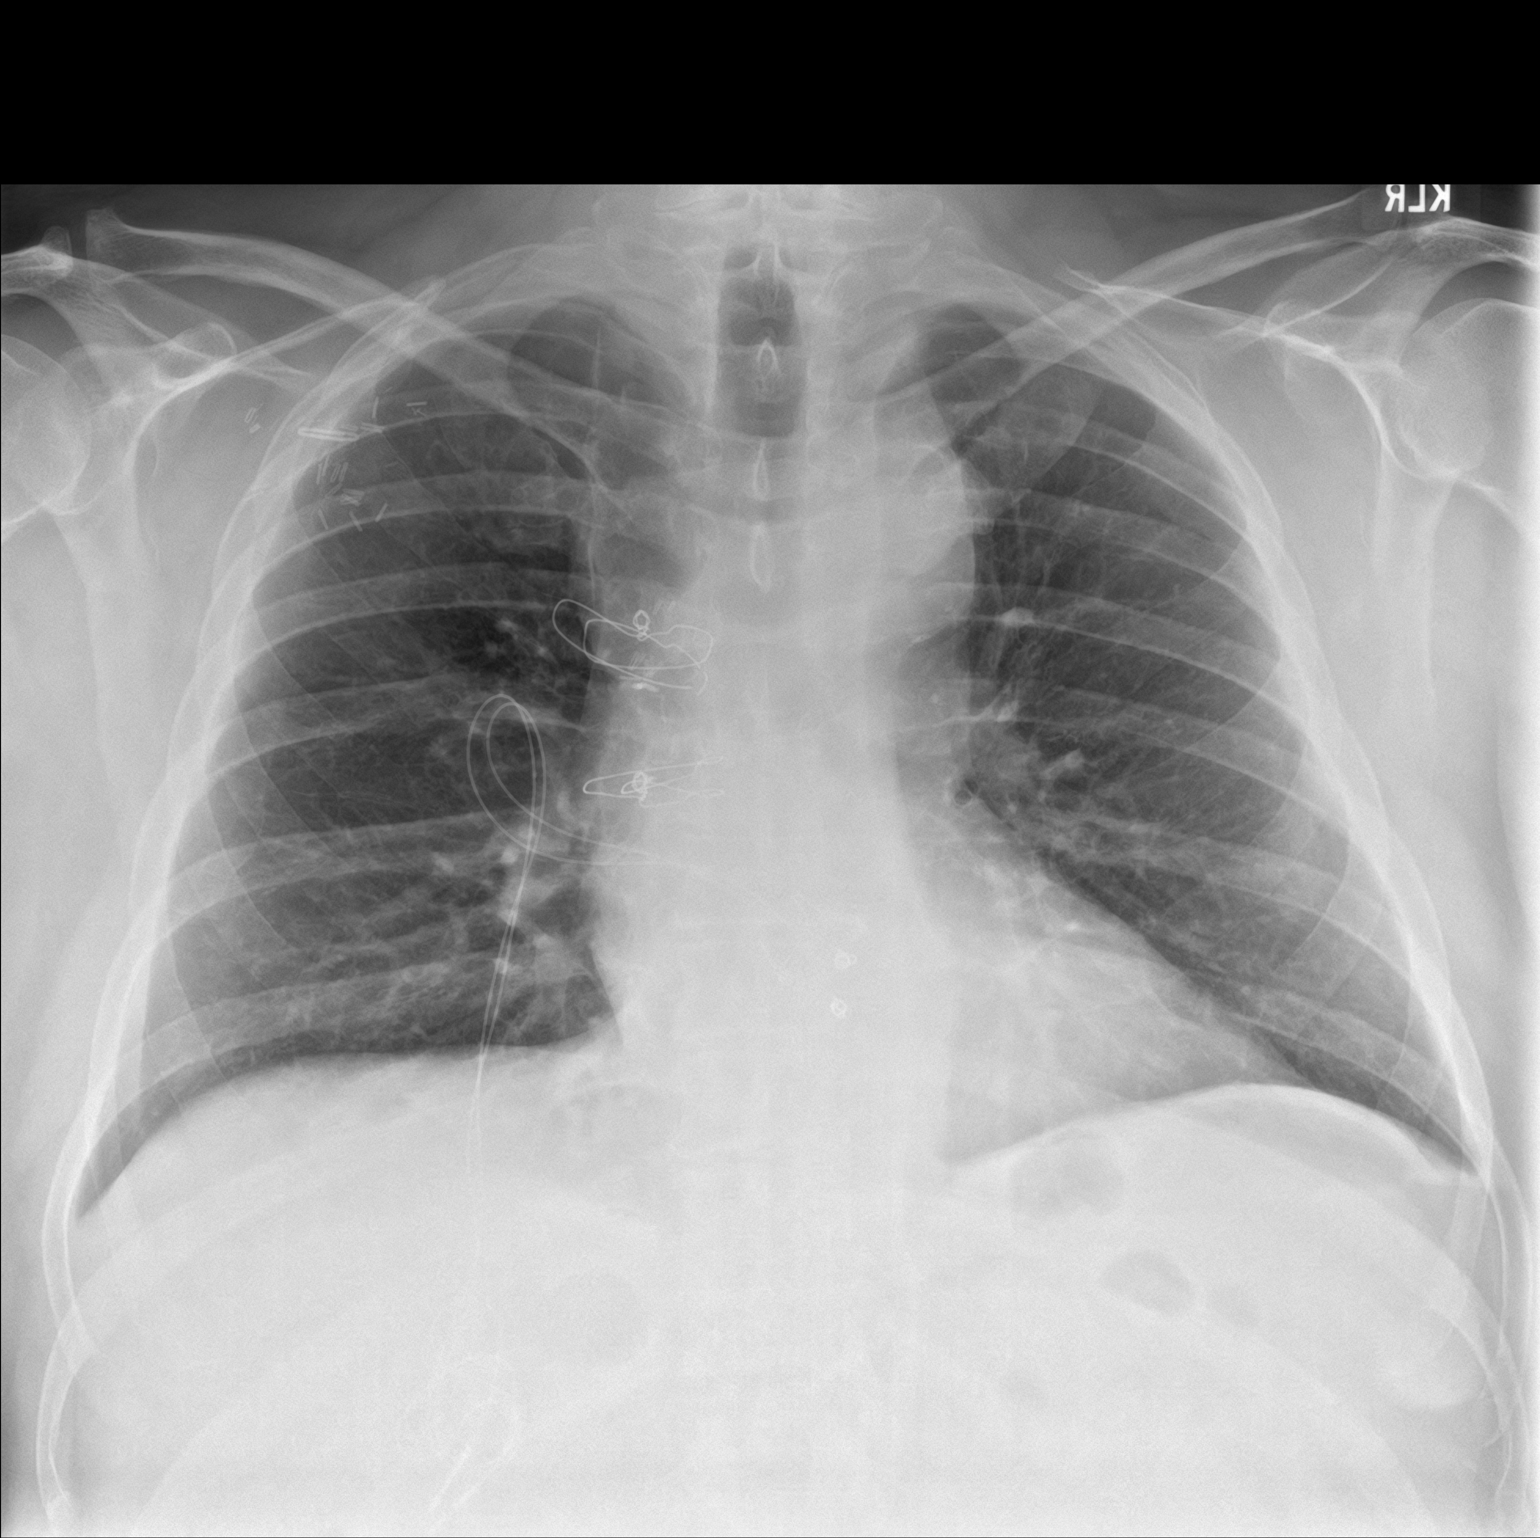

[chest lat]
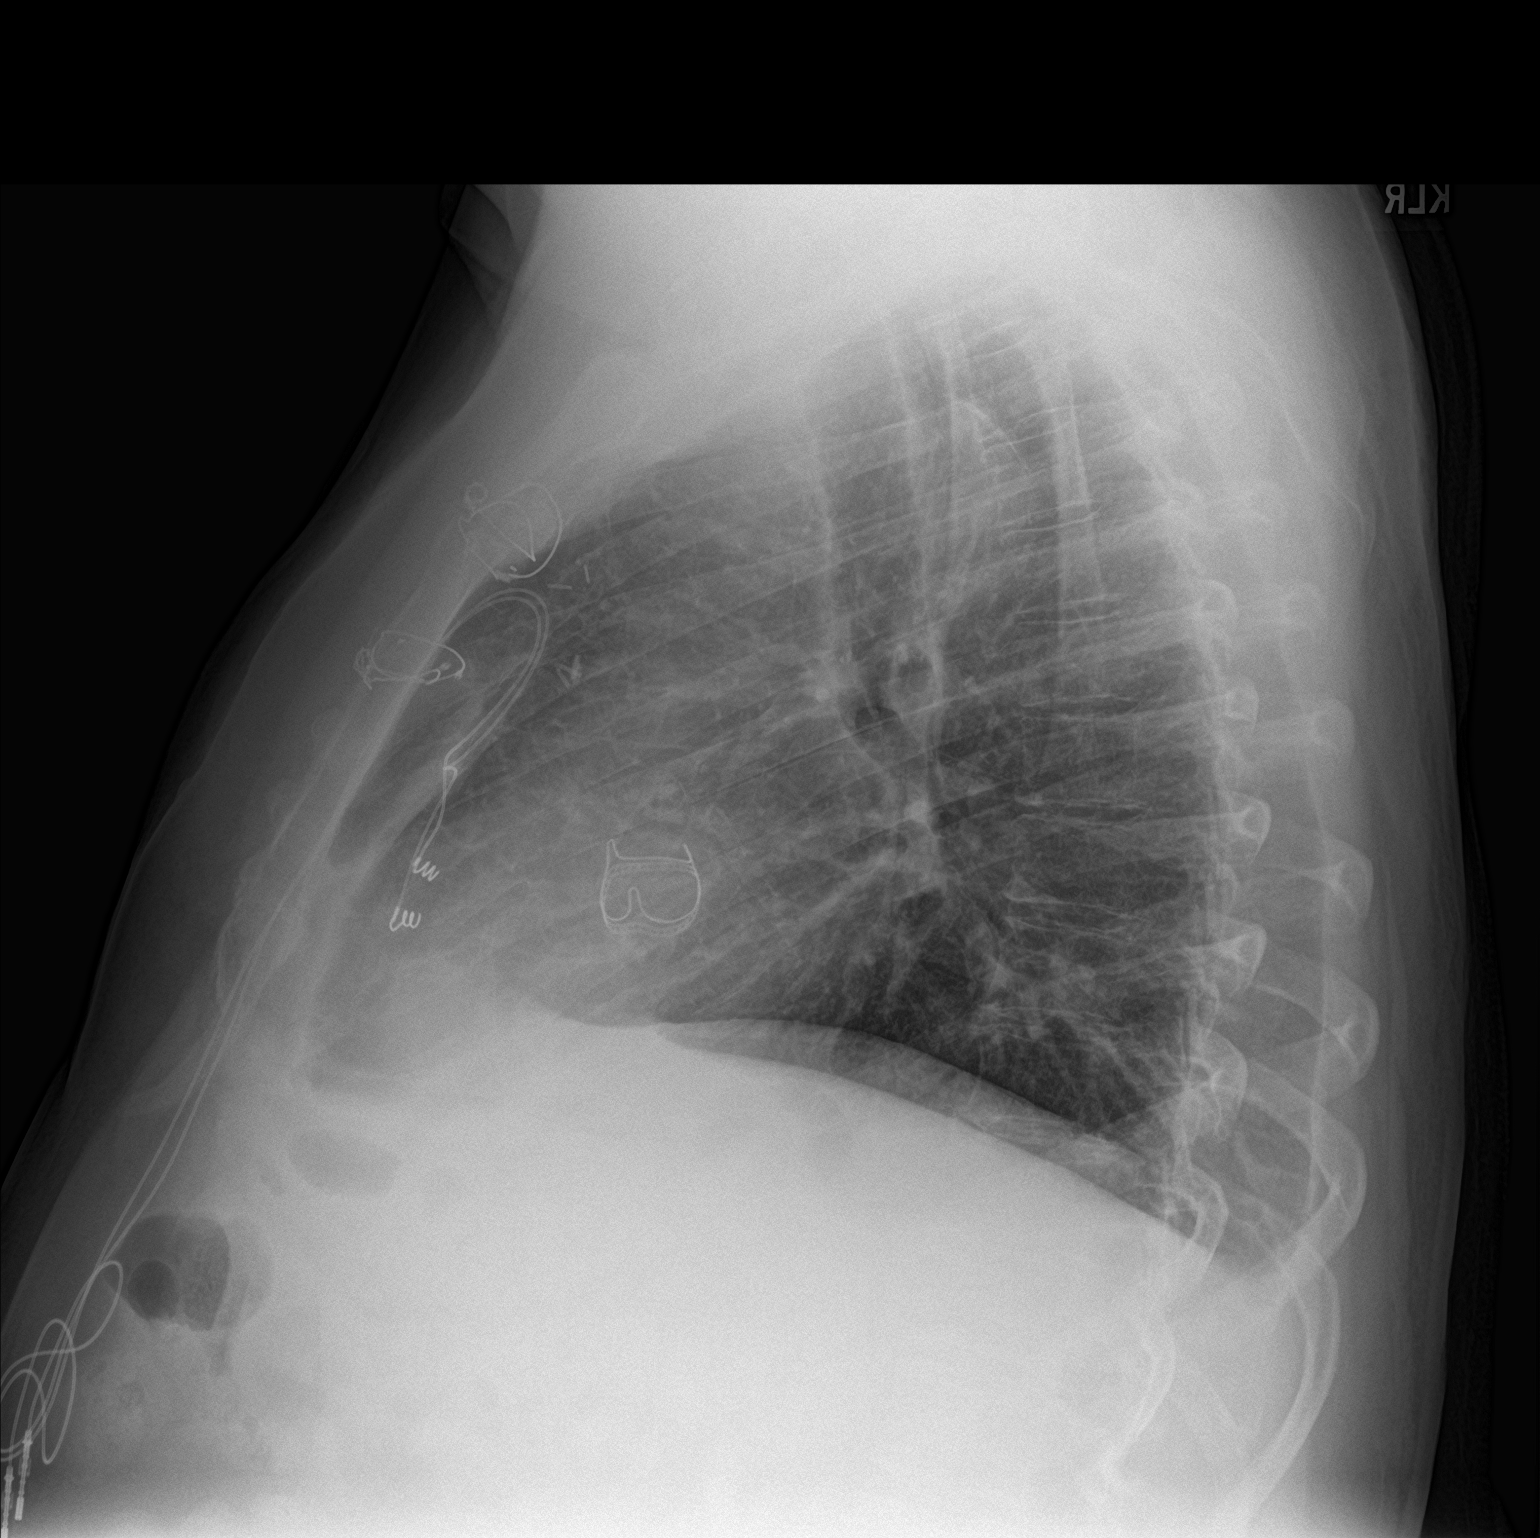

[2 of 2 positions shown; findings below may reference images not displayed]

FINDINGS: The heart size and pulmonary vascularity are normal and the lungs
are clear. Slight abnormal contour of the aortic arch. The patient
had repair of aortic coarctation at age 12. Aortic valve prosthesis.
Previous median sternotomy. Epicardial pacing leads still in place.

There is a small patchy area of increased density at the left base
laterally, not appreciable on the lateral view.

Multiple surgical clips in the right axilla.
IMPRESSION: Ill-defined density at the left base laterally. This was not present
on the prior exams.

Follow-up chest x-ray recommended to ensure clearing if the patient
is to be treated for respiratory infection. Otherwise, CT scan of
the chest with contrast recommended for further evaluation.
